# Patient Record
Sex: Female | Born: 1965 | Race: White | Hispanic: No | Smoking: Former smoker
Health system: Southern US, Community
[De-identification: ages and names within clinical notes are randomized; demographics above are authoritative.]

## PROBLEM LIST (undated history)

## (undated) DIAGNOSIS — E039 Hypothyroidism, unspecified: Secondary | ICD-10-CM

## (undated) DIAGNOSIS — E063 Autoimmune thyroiditis: Secondary | ICD-10-CM

## (undated) DIAGNOSIS — E785 Hyperlipidemia, unspecified: Secondary | ICD-10-CM

## (undated) HISTORY — DX: Hyperlipidemia, unspecified: E78.5

## (undated) HISTORY — DX: Autoimmune thyroiditis: E06.3

## (undated) HISTORY — DX: Hypothyroidism, unspecified: E03.9

## (undated) HISTORY — PX: CHEST WALL BIOPSY: SHX1338

---

## 2015-10-20 HISTORY — PX: BREAST BIOPSY: SHX20

## 2017-12-24 ENCOUNTER — Other Ambulatory Visit: Payer: Self-pay | Admitting: Obstetrics and Gynecology

## 2017-12-29 ENCOUNTER — Other Ambulatory Visit: Payer: Self-pay | Admitting: Obstetrics and Gynecology

## 2017-12-29 DIAGNOSIS — N63 Unspecified lump in unspecified breast: Secondary | ICD-10-CM

## 2018-01-07 ENCOUNTER — Other Ambulatory Visit: Payer: Self-pay | Admitting: Obstetrics and Gynecology

## 2018-01-07 DIAGNOSIS — N63 Unspecified lump in unspecified breast: Secondary | ICD-10-CM

## 2018-01-07 DIAGNOSIS — R928 Other abnormal and inconclusive findings on diagnostic imaging of breast: Secondary | ICD-10-CM

## 2018-01-07 DIAGNOSIS — N6001 Solitary cyst of right breast: Secondary | ICD-10-CM

## 2018-01-18 ENCOUNTER — Ambulatory Visit
Admission: RE | Admit: 2018-01-18 | Discharge: 2018-01-18 | Disposition: A | Discharge status: Home / Self Care | Payer: Managed Care, Other (non HMO) | Source: Ambulatory Visit | Attending: Obstetrics and Gynecology | Admitting: Obstetrics and Gynecology

## 2018-01-18 ENCOUNTER — Ambulatory Visit: Payer: Self-pay

## 2018-01-18 DIAGNOSIS — R928 Other abnormal and inconclusive findings on diagnostic imaging of breast: Secondary | ICD-10-CM

## 2018-01-18 DIAGNOSIS — N6001 Solitary cyst of right breast: Secondary | ICD-10-CM

## 2018-01-18 DIAGNOSIS — N63 Unspecified lump in unspecified breast: Secondary | ICD-10-CM

## 2018-03-24 ENCOUNTER — Other Ambulatory Visit: Payer: Self-pay | Admitting: Internal Medicine

## 2018-03-24 DIAGNOSIS — E042 Nontoxic multinodular goiter: Secondary | ICD-10-CM

## 2018-04-29 ENCOUNTER — Ambulatory Visit
Admission: RE | Admit: 2018-04-29 | Discharge: 2018-04-29 | Disposition: A | Discharge status: Home / Self Care | Payer: Managed Care, Other (non HMO) | Source: Ambulatory Visit | Attending: Internal Medicine | Admitting: Internal Medicine

## 2018-04-29 DIAGNOSIS — E042 Nontoxic multinodular goiter: Secondary | ICD-10-CM

## 2018-05-17 ENCOUNTER — Encounter: Payer: Self-pay | Admitting: Endocrinology

## 2018-05-17 ENCOUNTER — Ambulatory Visit: Payer: Managed Care, Other (non HMO) | Admitting: Endocrinology

## 2018-05-17 DIAGNOSIS — E039 Hypothyroidism, unspecified: Secondary | ICD-10-CM

## 2018-05-17 DIAGNOSIS — E079 Disorder of thyroid, unspecified: Secondary | ICD-10-CM | POA: Diagnosis not present

## 2018-05-17 NOTE — Progress Notes (Signed)
Subjective:    Patient ID: Veronica Schwartz, female    DOB: 11-04-1965, 52 y.o.   MRN: [id number]  HPI Pt is referred by Dr Corinna Capra, for nodular thyroid.  Pt was dx'ed with hypothyroidism in 1983.  She was noted to have a goiter in 2018.  she has no h/o XRT or surgery to the neck.  She has slight pressure sensation at the ant neck, and assoc deepening of the voice.   Past Medical History:  Diagnosis Date  . Hypothyroidism     Social History   Socioeconomic History  . Marital status: Single    Spouse name: Not on file  . Number of children: Not on file  . Years of education: Not on file  . Highest education level: Not on file  Occupational History  . Not on file  Social Needs  . Financial resource strain: Not on file  . Food insecurity:    Worry: Not on file    Inability: Not on file  . Transportation needs:    Medical: Not on file    Non-medical: Not on file  Tobacco Use  . Smoking status: Former Research scientist (life sciences)  . Smokeless tobacco: Never Used  Substance and Sexual Activity  . Alcohol use: Not Currently  . Drug use: Never  . Sexual activity: Not Currently  Lifestyle  . Physical activity:    Days per week: Not on file    Minutes per session: Not on file  . Stress: Not on file  Relationships  . Social connections:    Talks on phone: Not on file    Gets together: Not on file    Attends religious service: Not on file    Active member of club or organization: Not on file    Attends meetings of clubs or organizations: Not on file    Relationship status: Not on file  . Intimate partner violence:    Fear of current or ex partner: Not on file    Emotionally abused: Not on file    Physically abused: Not on file    Forced sexual activity: Not on file  Other Topics Concern  . Not on file  Social History Narrative  . Not on file    Current Outpatient Medications on File Prior to Visit  Medication Sig Dispense Refill  . ACZONE 7.5 % GEL APPLY A thin layer TO FACE IN THE morning  2    . SULFACLEANSE 8/4 8-4 % SUSP     . SYNTHROID 125 MCG tablet     . valACYclovir (VALTREX) 1000 MG tablet      No current facility-administered medications on file prior to visit.     Allergies  Allergen Reactions  . Penicillins     Family History  Problem Relation Age of Onset  . Breast cancer Mother 34  . Breast cancer Maternal Grandmother 60    BP (!) 96/50 (BP Location: Right Arm, Patient Position: Sitting, Cuff Size: Normal)   Pulse 72   Temp 98.8 F (37.1 C) (Oral)   Ht 5' 2.25" (1.581 m)   Wt 131 lb (59.4 kg)   LMP 04/26/2018   SpO2 97%   BMI 23.77 kg/m    Review of Systems Denies weight change, fatigue, neck pain, visual loss, chest pain, sob, cough, diarrhea, itching, flushing, easy bruising, depression, cold intolerance, headache, and numbness.  She has slightly painful swallowing and rhinorrhea.     Objective:   Physical Exam VS: see vs page GEN: no distress  HEAD: head: no deformity eyes: no periorbital swelling, no proptosis external nose and ears are normal mouth: no lesion seen NECK: approx 5 cm right thyroid mass.  CHEST WALL: no deformity LUNGS: clear to auscultation CV: reg rate and rhythm, no murmur ABD: abdomen is soft, nontender.  no hepatosplenomegaly.  not distended.  no hernia MUSCULOSKELETAL: muscle bulk and strength are grossly normal.  no obvious joint swelling.  gait is normal and steady EXTEMITIES: no deformity.  no ulcer on the feet.  feet are of normal color and temp.  no edema PULSES: dorsalis pedis intact bilat.  no carotid bruit NEURO:  cn 2-12 grossly intact.   readily moves all 4's.  sensation is intact to touch on the feet SKIN:  Normal texture and temperature.  No rash or suspicious lesion is visible.   NODES:  None palpable at the neck PSYCH: alert, well-oriented.  Does not appear anxious nor depressed.   [place] (2019): Multinodular and heterogeneous appearance of the thyroid gland with larger right lobe compared to left. It  is impossible to accurately measure nodules as heterogeneous nodular parenchyma is seen throughout both lobes. No significant suspicious features are identified in the thyroid gland by ultrasound.  outside test results are reviewed: [place] (2018) right thyroid mass was slightly smaller than 2019  We have requested ROI for past [place] and cytology from CT (approx 2016).    Lab Results  Component Value Date   TSH 0.62 05/19/2018      Assessment & Plan:  Thyroid mass, new to me, uncertain etiology.  Euthyroid.  Due to enlargement and size, I advised resection.    Patient Instructions  blood tests are requested for you today.  We'll let you know about the results.  Please see Dr Harlow Asa soon, as scheduled.

## 2018-05-17 NOTE — Patient Instructions (Addendum)
blood tests are requested for you today.  We'll let you know about the results.  Please see Dr Harlow Asa soon, as scheduled.

## 2018-05-18 ENCOUNTER — Other Ambulatory Visit: Payer: Managed Care, Other (non HMO)

## 2018-05-19 ENCOUNTER — Other Ambulatory Visit (INDEPENDENT_AMBULATORY_CARE_PROVIDER_SITE_OTHER): Payer: Managed Care, Other (non HMO)

## 2018-05-19 ENCOUNTER — Encounter: Payer: Self-pay | Admitting: Endocrinology

## 2018-05-19 DIAGNOSIS — E079 Disorder of thyroid, unspecified: Secondary | ICD-10-CM | POA: Diagnosis not present

## 2018-05-19 DIAGNOSIS — E039 Hypothyroidism, unspecified: Secondary | ICD-10-CM | POA: Insufficient documentation

## 2018-05-19 LAB — TSH: TSH: 0.62 u[IU]/mL (ref 0.35–4.50)

## 2018-05-20 ENCOUNTER — Telehealth: Payer: Self-pay | Admitting: Emergency Medicine

## 2018-05-20 NOTE — Telephone Encounter (Signed)
I have faxed to Dr. Catalina Antigua.

## 2018-05-20 NOTE — Telephone Encounter (Signed)
Pt called back and asked if Lab work can been sent over to Dr Harlow Asa at [facility] on [facility] . Phone   Number is [phone] and Fax number is [phone]. Thanks.

## 2018-05-25 ENCOUNTER — Ambulatory Visit: Payer: Self-pay | Admitting: Surgery

## 2018-08-02 NOTE — Patient Instructions (Addendum)
Veronica Schwartz  08/02/2018   Your procedure is scheduled on: 08-17-18  Report to [facility] Main  Entrance  Report to admitting at      [place] AM    Call this number if you have problems the morning of surgery [phone]    Remember: Do not eat food or drink liquids :After Midnight. BRUSH YOUR TEETH MORNING OF SURGERY AND RINSE YOUR MOUTH OUT,       NO CHEWING GUM CANDY OR MINTS.     Take these medicines the morning of surgery with A SIP OF WATER: valtrex,  synthyroid                                You may not have any metal on your body including hair pins and              piercings  Do not wear jewelry, make-up, lotions, powders or perfumes, deodorant             Do not wear nail polish.  Do not shave  48 hours prior to surgery.               Do not bring valuables to the hospital. [place].  Contacts, dentures or bridgework may not be worn into surgery.  Leave suitcase in the car. After surgery it may be brought to your room.               Please read over the following fact sheets you were given: _____________________________________________________________________           [facility] - Preparing for Surgery Before surgery, you can play an important role.  Because skin is not sterile, your skin needs to be as free of germs as possible.  You can reduce the number of germs on your skin by washing with CHG (chlorahexidine gluconate) soap before surgery.  CHG is an antiseptic cleaner which kills germs and bonds with the skin to continue killing germs even after washing. Please DO NOT use if you have an allergy to CHG or antibacterial soaps.  If your skin becomes reddened/irritated stop using the CHG and inform your nurse when you arrive at Short Stay. Do not shave (including legs and underarms) for at least 48 hours prior to the first CHG shower.  You may shave your face/neck. Please follow these instructions  carefully:  1.  Shower with CHG Soap the night before surgery and the  morning of Surgery.  2.  If you choose to wash your hair, wash your hair first as usual with your  normal  shampoo.  3.  After you shampoo, rinse your hair and body thoroughly to remove the  shampoo.                           4.  Use CHG as you would any other liquid soap.  You can apply chg directly  to the skin and wash                       Gently with a scrungie or clean washcloth.  5.  Apply the CHG Soap to your body ONLY FROM THE NECK  DOWN.   Do not use on face/ open                           Wound or open sores. Avoid contact with eyes, ears mouth and genitals (private parts).                       Wash face,  Genitals (private parts) with your normal soap.             6.  Wash thoroughly, paying special attention to the area where your surgery  will be performed.  7.  Thoroughly rinse your body with warm water from the neck down.  8.  DO NOT shower/wash with your normal soap after using and rinsing off  the CHG Soap.                9.  Pat yourself dry with a clean towel.            10.  Wear clean pajamas.            11.  Place clean sheets on your bed the night of your first shower and do not  sleep with pets. Day of Surgery : Do not apply any lotions/deodorants the morning of surgery.  Please wear clean clothes to the hospital/surgery center.  FAILURE TO FOLLOW THESE INSTRUCTIONS MAY RESULT IN THE CANCELLATION OF YOUR SURGERY PATIENT SIGNATURE_________________________________  NURSE SIGNATURE__________________________________  ________________________________________________________________________

## 2018-08-05 ENCOUNTER — Other Ambulatory Visit: Payer: Self-pay

## 2018-08-05 ENCOUNTER — Encounter (HOSPITAL_COMMUNITY): Payer: Self-pay

## 2018-08-05 ENCOUNTER — Ambulatory Visit (HOSPITAL_COMMUNITY)
Admission: RE | Admit: 2018-08-05 | Discharge: 2018-08-05 | Disposition: A | Discharge status: Home / Self Care | Payer: Managed Care, Other (non HMO) | Source: Ambulatory Visit | Attending: Anesthesiology | Admitting: Anesthesiology

## 2018-08-05 ENCOUNTER — Encounter (HOSPITAL_COMMUNITY)
Admission: RE | Admit: 2018-08-05 | Discharge: 2018-08-05 | Disposition: A | Discharge status: Home / Self Care | Payer: Managed Care, Other (non HMO) | Source: Ambulatory Visit | Attending: Surgery | Admitting: Surgery

## 2018-08-05 DIAGNOSIS — Z01818 Encounter for other preprocedural examination: Secondary | ICD-10-CM

## 2018-08-05 DIAGNOSIS — E063 Autoimmune thyroiditis: Secondary | ICD-10-CM | POA: Diagnosis not present

## 2018-08-05 DIAGNOSIS — D440 Neoplasm of uncertain behavior of thyroid gland: Secondary | ICD-10-CM | POA: Diagnosis not present

## 2018-08-05 LAB — CBC
HEMATOCRIT: 41.6 % (ref 36.0–46.0)
Hemoglobin: 13.8 g/dL (ref 12.0–15.0)
MCH: 34.8 pg — AB (ref 26.0–34.0)
MCHC: 33.2 g/dL (ref 30.0–36.0)
MCV: 105.1 fL — AB (ref 80.0–100.0)
NRBC: 0 % (ref 0.0–0.2)
Platelets: 297 10*3/uL (ref 150–400)
RBC: 3.96 MIL/uL (ref 3.87–5.11)
RDW: 12.1 % (ref 11.5–15.5)
WBC: 6.4 10*3/uL (ref 4.0–10.5)

## 2018-08-05 LAB — HCG, SERUM, QUALITATIVE: Preg, Serum: NEGATIVE

## 2018-08-15 ENCOUNTER — Encounter (HOSPITAL_COMMUNITY): Payer: Self-pay | Admitting: Surgery

## 2018-08-15 NOTE — H&P (Signed)
General Surgery [facility]  Veronica Schwartz DOB: Feb 04, 1966 Divorced / Language: English / Race: White Female   History of Present Illness   The patient is a 52 year old female who presents with a complaint of Enlarged thyroid.  CHIEF COMPLAINT: Hashimoto's thyroiditis, enlarged right lobe  Patient is referred by Dr. Renato Shin for surgical evaluation and management of Hashimoto's thyroiditis with enlargement of the right thyroid lobe with mild to moderate compressive symptoms. Patient has had a long-standing history of Hashimoto's thyroiditis dating back to age 53 years. She is on Synthroid 125 g daily with a normal TSH level of 0.62. Patient has recently relocated from [place] to [place]. Most of her follow-up has been in [place] including sequential ultrasound scanning and previous fine-needle aspiration biopsies. Biopsies reportedly showed Hurthle cell changes. Patient has previously discussed surgery but opted for continued observation. Patient was seen by Dr. Lissa Merlin in consultation. She underwent an ultrasound of the thyroid on April 29, 2018. This shows an enlarged right thyroid lobe measuring 7.3 x 2.8 x 3.5 cm. Left lobe is normal in size. Entire thyroid gland is heterogeneous but without discrete or dominant nodules. There are no worrisome findings. Biopsy was not recommended. Patient is now developing mild compressive symptoms with changes in voice quality, snoring, and discomfort in the right thyroid lobe. She denies any dysphagia. After consultation with her endocrinologist, the patient is interested in proceeding with right thyroid lobectomy for control of symptoms and definitive diagnosis. Patient has had no prior head or neck surgery. There is a family history of thyroid goiter in the patient's mother who underwent surgical resection. There is no family history of other endocrine neoplasms.   Past Surgical History Breast Biopsy   Left. Cesarean Section - 1  Colon Polyp Removal - Colonoscopy   Diagnostic Studies History  Colonoscopy  1-5 years ago Mammogram  within last year Pap Smear  1-5 years ago  Allergies Penicillins   Medication History ValACYclovir HCl (1GM Tablet, Oral) Active. Synthroid (125MCG Tablet, Oral) Active.  Social History Alcohol use  Recently quit alcohol use. Caffeine use  Coffee. No drug use  Tobacco use  Former smoker.  Family History Breast Cancer  Mother. Heart Disease  Brother, Father. Heart disease in female family member before age 109  Respiratory Condition  Mother. Thyroid problems  Mother.  Pregnancy / Birth History  Age at menarche  67 years. Gravida  3 Length (months) of breastfeeding  12-24 Maternal age  29-40 Para  2 Regular periods   Other Problems  Thyroid Disease  Review of Systems  General Not Present- Appetite Loss, Chills, Fatigue, Fever, Night Sweats, Weight Gain and Weight Loss. Skin Not Present- Change in Wart/Mole, Dryness, Hives, Jaundice, New Lesions, Non-Healing Wounds, Rash and Ulcer. HEENT Present- Hoarseness. Not Present- Earache, Hearing Loss, Nose Bleed, Oral Ulcers, Ringing in the Ears, Seasonal Allergies, Sinus Pain, Sore Throat, Visual Disturbances, Wears glasses/contact lenses and Yellow Eyes. Respiratory Present- Snoring. Not Present- Bloody sputum, Chronic Cough, Difficulty Breathing and Wheezing. Breast Not Present- Breast Mass, Breast Pain, Nipple Discharge and Skin Changes. Cardiovascular Not Present- Chest Pain, Difficulty Breathing Lying Down, Leg Cramps, Palpitations, Rapid Heart Rate, Shortness of Breath and Swelling of Extremities. Gastrointestinal Not Present- Abdominal Pain, Bloating, Bloody Stool, Change in Bowel Habits, Chronic diarrhea, Constipation, Difficulty Swallowing, Excessive gas, Gets full quickly at meals, Hemorrhoids, Indigestion, Nausea, Rectal Pain and Vomiting. Female Genitourinary Not  Present- Frequency, Nocturia, Painful Urination, Pelvic Pain and Urgency. Musculoskeletal  Not Present- Back Pain, Joint Pain, Joint Stiffness, Muscle Pain, Muscle Weakness and Swelling of Extremities. Neurological Not Present- Decreased Memory, Fainting, Headaches, Numbness, Seizures, Tingling, Tremor, Trouble walking and Weakness. Psychiatric Not Present- Anxiety, Bipolar, Change in Sleep Pattern, Depression, Fearful and Frequent crying. Endocrine Not Present- Cold Intolerance, Excessive Hunger, Hair Changes, Heat Intolerance, Hot flashes and New Diabetes. Hematology Not Present- Blood Thinners, Easy Bruising, Excessive bleeding, Gland problems, HIV and Persistent Infections.  Vitals Weight: 129.8 lb Height: 62in Body Surface Area: 1.59 m Body Mass Index: 23.74 kg/m  Temp.: 97.38F  Pulse: 59 (Regular)  BP: 90/52 (Sitting, Left Arm, Standard)  Physical Exam   See vital signs recorded above  GENERAL APPEARANCE Development: normal Nutritional status: normal Gross deformities: none  SKIN Rash, lesions, ulcers: none Induration, erythema: none Nodules: none palpable  EYES Conjunctiva and lids: normal Pupils: equal and reactive Iris: normal bilaterally  EARS, NOSE, MOUTH, THROAT External ears: no lesion or deformity External nose: no lesion or deformity Hearing: grossly normal Lips: no lesion or deformity Dentition: normal for age Oral mucosa: moist  NECK Symmetric: no Trachea: midline Thyroid: Left thyroid lobe is normal in size without appreciable masses; right thyroid lobe is visibly enlarged, very firm, mildly to moderately tender to palpation. It is mobile with swallowing.  CHEST Respiratory effort: normal Retraction or accessory muscle use: no Breath sounds: normal bilaterally Rales, rhonchi, wheeze: none  CARDIOVASCULAR Auscultation: regular rhythm, normal rate Murmurs: none Pulses: carotid and radial pulse 2+ palpable Lower extremity edema:  none Lower extremity varicosities: none  MUSCULOSKELETAL Station and gait: normal Digits and nails: no clubbing or cyanosis Muscle strength: grossly normal all extremities Range of motion: grossly normal all extremities Deformity: none  LYMPHATIC Cervical: none palpable Supraclavicular: none palpable  PSYCHIATRIC Oriented to person, place, and time: yes Mood and affect: normal for situation Judgment and insight: appropriate for situation   Assessment & Plan  HASHIMOTO'S THYROIDITIS (E06.3) RIGHT THYROID NODULE (E04.1)  Pt Education - Pamphlet Given - The Thyroid Book: discussed with patient and provided information.  Patient is referred by her endocrinologist for evaluation for right thyroid lobectomy for management of enlarged right thyroid lobe with history of Hashimoto's thyroiditis and Hurthle cell changes. Patient is provided with written literature on thyroid surgery to review at home.  We reviewed the ultrasound report from early July. We discussed her clinical history. We reviewed her laboratory studies. We discussed indications for thyroid surgery. Patient has an enlarging right thyroid lobe which has mild compressive symptoms. She has had previous biopsies which places her in an intermediate risk for underlying malignancy. Patient could certainly be followed with sequential ultrasound scanning and laboratory studies and physical examination. However, there is the option to proceed with right thyroid lobectomy for definitive diagnosis and management. I would not recommend total thyroidectomy and less malignancy were confirmed.  We have discussed right thyroid lobectomy in detail. We have discussed the risk and benefits of the procedure including the risk of injury to recurrent laryngeal nerves and the parathyroid glands. We have discussed the location of the surgical incision. We discussed the hospital stay to be anticipated. We have discussed the postoperative  recovery and return to activities. We have discussed the potential need for additional surgery in the event of malignancy. Patient understands and wishes to proceed with surgery in the near future.  The risks and benefits of the procedure have been discussed at length with the patient. The patient understands the proposed procedure, potential alternative treatments, and the  course of recovery to be expected. All of the patient's questions have been answered at this time. The patient wishes to proceed with surgery.  Armandina Gemma, [facility] Office: [phone]

## 2018-08-17 ENCOUNTER — Other Ambulatory Visit: Payer: Self-pay

## 2018-08-17 ENCOUNTER — Observation Stay (HOSPITAL_COMMUNITY)
Admission: RE | Admit: 2018-08-17 | Discharge: 2018-08-18 | Disposition: A | Discharge status: Home / Self Care | Payer: Managed Care, Other (non HMO) | Source: Ambulatory Visit | Attending: Surgery | Admitting: Surgery

## 2018-08-17 ENCOUNTER — Ambulatory Visit (HOSPITAL_COMMUNITY): Payer: Managed Care, Other (non HMO) | Admitting: Anesthesiology

## 2018-08-17 ENCOUNTER — Encounter (HOSPITAL_COMMUNITY)
Admission: RE | Disposition: A | Discharge status: Home / Self Care | Payer: Self-pay | Source: Ambulatory Visit | Attending: Surgery

## 2018-08-17 ENCOUNTER — Encounter (HOSPITAL_COMMUNITY): Payer: Self-pay | Admitting: *Deleted

## 2018-08-17 DIAGNOSIS — Z880 Allergy status to penicillin: Secondary | ICD-10-CM | POA: Insufficient documentation

## 2018-08-17 DIAGNOSIS — Z87891 Personal history of nicotine dependence: Secondary | ICD-10-CM | POA: Diagnosis not present

## 2018-08-17 DIAGNOSIS — E063 Autoimmune thyroiditis: Principal | ICD-10-CM | POA: Insufficient documentation

## 2018-08-17 DIAGNOSIS — E039 Hypothyroidism, unspecified: Secondary | ICD-10-CM | POA: Diagnosis present

## 2018-08-17 DIAGNOSIS — E079 Disorder of thyroid, unspecified: Secondary | ICD-10-CM | POA: Diagnosis present

## 2018-08-17 DIAGNOSIS — E041 Nontoxic single thyroid nodule: Secondary | ICD-10-CM | POA: Diagnosis present

## 2018-08-17 HISTORY — PX: THYROID LOBECTOMY: SHX420

## 2018-08-17 SURGERY — LOBECTOMY, THYROID
Anesthesia: General | Laterality: Right

## 2018-08-17 MED ORDER — MIDAZOLAM HCL 2 MG/2ML IJ SOLN
INTRAMUSCULAR | Status: AC
  Filled 2018-08-17: qty 2

## 2018-08-17 MED ORDER — CHLORHEXIDINE GLUCONATE CLOTH 2 % EX PADS: 6.0000 | MEDICATED_PAD | Freq: Once | CUTANEOUS | Status: DC

## 2018-08-17 MED ORDER — ACETAMINOPHEN 500 MG PO TABS: 1000.0000 mg | ORAL_TABLET | Freq: Once | ORAL | Status: DC | PRN

## 2018-08-17 MED ORDER — CIPROFLOXACIN IN D5W 400 MG/200ML IV SOLN
400.0000 mg | INTRAVENOUS | Status: AC
  Administered 2018-08-17: 400 mg via INTRAVENOUS
  Filled 2018-08-17: qty 200

## 2018-08-17 MED ORDER — ROCURONIUM BROMIDE 10 MG/ML (PF) SYRINGE
PREFILLED_SYRINGE | INTRAVENOUS | Status: AC
  Filled 2018-08-17: qty 20

## 2018-08-17 MED ORDER — SUGAMMADEX SODIUM 200 MG/2ML IV SOLN
INTRAVENOUS | Status: AC
  Filled 2018-08-17: qty 4

## 2018-08-17 MED ORDER — LIDOCAINE 2% (20 MG/ML) 5 ML SYRINGE
INTRAMUSCULAR | Status: DC | PRN
  Administered 2018-08-17: 100 mg via INTRAVENOUS

## 2018-08-17 MED ORDER — ONDANSETRON HCL 4 MG/2ML IJ SOLN: 4.0000 mg | Freq: Four times a day (QID) | INTRAMUSCULAR | Status: DC | PRN

## 2018-08-17 MED ORDER — MIDAZOLAM HCL 5 MG/5ML IJ SOLN
INTRAMUSCULAR | Status: DC | PRN
  Administered 2018-08-17: 2 mg via INTRAVENOUS

## 2018-08-17 MED ORDER — FENTANYL CITRATE (PF) 100 MCG/2ML IJ SOLN
INTRAMUSCULAR | Status: DC | PRN
  Administered 2018-08-17: 50 ug via INTRAVENOUS
  Administered 2018-08-17: 25 ug via INTRAVENOUS
  Administered 2018-08-17: 75 ug via INTRAVENOUS
  Administered 2018-08-17: 50 ug via INTRAVENOUS

## 2018-08-17 MED ORDER — OXYCODONE HCL 5 MG/5ML PO SOLN
5.0000 mg | Freq: Once | ORAL | Status: DC | PRN
  Filled 2018-08-17: qty 5

## 2018-08-17 MED ORDER — ACETAMINOPHEN 160 MG/5ML PO SOLN: 1000.0000 mg | Freq: Once | ORAL | Status: DC | PRN

## 2018-08-17 MED ORDER — SUGAMMADEX SODIUM 200 MG/2ML IV SOLN
INTRAVENOUS | Status: DC | PRN
  Administered 2018-08-17: 150 mg via INTRAVENOUS

## 2018-08-17 MED ORDER — FENTANYL CITRATE (PF) 100 MCG/2ML IJ SOLN
25.0000 ug | INTRAMUSCULAR | Status: DC | PRN
  Administered 2018-08-17: 50 ug via INTRAVENOUS

## 2018-08-17 MED ORDER — ACETAMINOPHEN 650 MG RE SUPP
650.0000 mg | Freq: Four times a day (QID) | RECTAL | Status: DC | PRN
  Filled 2018-08-17: qty 1

## 2018-08-17 MED ORDER — EPHEDRINE 5 MG/ML INJ
INTRAVENOUS | Status: AC
  Filled 2018-08-17: qty 10

## 2018-08-17 MED ORDER — FENTANYL CITRATE (PF) 100 MCG/2ML IJ SOLN
INTRAMUSCULAR | Status: AC
  Filled 2018-08-17: qty 2

## 2018-08-17 MED ORDER — 0.9 % SODIUM CHLORIDE (POUR BTL) OPTIME
TOPICAL | Status: DC | PRN
  Administered 2018-08-17: 1000 mL

## 2018-08-17 MED ORDER — ACETAMINOPHEN 10 MG/ML IV SOLN: 1000.0000 mg | Freq: Once | INTRAVENOUS | Status: DC | PRN

## 2018-08-17 MED ORDER — PROPOFOL 10 MG/ML IV BOLUS
INTRAVENOUS | Status: DC | PRN
  Administered 2018-08-17: 180 mg via INTRAVENOUS

## 2018-08-17 MED ORDER — GUAIFENESIN-DM 100-10 MG/5ML PO SYRP: 10.0000 mL | ORAL_SOLUTION | ORAL | Status: DC | PRN

## 2018-08-17 MED ORDER — KCL IN DEXTROSE-NACL 20-5-0.45 MEQ/L-%-% IV SOLN
INTRAVENOUS | Status: DC
  Administered 2018-08-17: 16:00:00 via INTRAVENOUS
  Filled 2018-08-17: qty 1000

## 2018-08-17 MED ORDER — ZOLPIDEM TARTRATE 5 MG PO TABS
5.0000 mg | ORAL_TABLET | Freq: Once | ORAL | Status: AC
  Administered 2018-08-17: 5 mg via ORAL
  Filled 2018-08-17: qty 1

## 2018-08-17 MED ORDER — ONDANSETRON HCL 4 MG/2ML IJ SOLN
INTRAMUSCULAR | Status: AC
  Filled 2018-08-17: qty 2

## 2018-08-17 MED ORDER — ROCURONIUM BROMIDE 10 MG/ML (PF) SYRINGE
PREFILLED_SYRINGE | INTRAVENOUS | Status: DC | PRN
  Administered 2018-08-17: 50 mg via INTRAVENOUS
  Administered 2018-08-17 (×2): 10 mg via INTRAVENOUS

## 2018-08-17 MED ORDER — DEXAMETHASONE SODIUM PHOSPHATE 10 MG/ML IJ SOLN
INTRAMUSCULAR | Status: DC | PRN
  Administered 2018-08-17: 10 mg via INTRAVENOUS

## 2018-08-17 MED ORDER — ONDANSETRON HCL 4 MG/2ML IJ SOLN
INTRAMUSCULAR | Status: DC | PRN
  Administered 2018-08-17 (×2): 4 mg via INTRAVENOUS

## 2018-08-17 MED ORDER — TRAMADOL HCL 50 MG PO TABS
50.0000 mg | ORAL_TABLET | Freq: Four times a day (QID) | ORAL | Status: DC | PRN
  Administered 2018-08-17 – 2018-08-18 (×2): 50 mg via ORAL
  Filled 2018-08-17 (×2): qty 1

## 2018-08-17 MED ORDER — PROPOFOL 10 MG/ML IV BOLUS
INTRAVENOUS | Status: AC
  Filled 2018-08-17: qty 20

## 2018-08-17 MED ORDER — LIDOCAINE 2% (20 MG/ML) 5 ML SYRINGE
INTRAMUSCULAR | Status: AC
  Filled 2018-08-17: qty 10

## 2018-08-17 MED ORDER — EPHEDRINE SULFATE-NACL 50-0.9 MG/10ML-% IV SOSY
PREFILLED_SYRINGE | INTRAVENOUS | Status: DC | PRN
  Administered 2018-08-17: 10 mg via INTRAVENOUS

## 2018-08-17 MED ORDER — LACTATED RINGERS IV SOLN
INTRAVENOUS | Status: DC
  Administered 2018-08-17: 09:00:00 via INTRAVENOUS

## 2018-08-17 MED ORDER — OXYCODONE HCL 5 MG PO TABS: 5.0000 mg | ORAL_TABLET | Freq: Once | ORAL | Status: DC | PRN

## 2018-08-17 MED ORDER — ONDANSETRON HCL 4 MG/2ML IJ SOLN
INTRAMUSCULAR | Status: AC
  Filled 2018-08-17: qty 6

## 2018-08-17 MED ORDER — DEXAMETHASONE SODIUM PHOSPHATE 10 MG/ML IJ SOLN
INTRAMUSCULAR | Status: AC
  Filled 2018-08-17: qty 2

## 2018-08-17 MED ORDER — HYDROCODONE-ACETAMINOPHEN 5-325 MG PO TABS
1.0000 | ORAL_TABLET | ORAL | Status: DC | PRN
  Administered 2018-08-18: 1 via ORAL
  Filled 2018-08-17: qty 1

## 2018-08-17 MED ORDER — HEMOSTATIC AGENTS (NO CHARGE) OPTIME
TOPICAL | Status: DC | PRN
  Administered 2018-08-17: 1 via TOPICAL

## 2018-08-17 MED ORDER — LIP MEDEX EX OINT
TOPICAL_OINTMENT | CUTANEOUS | Status: AC
  Filled 2018-08-17: qty 7

## 2018-08-17 MED ORDER — HYDROMORPHONE HCL 1 MG/ML IJ SOLN: 1.0000 mg | INTRAMUSCULAR | Status: DC | PRN

## 2018-08-17 MED ORDER — DEXAMETHASONE SODIUM PHOSPHATE 10 MG/ML IJ SOLN
INTRAMUSCULAR | Status: AC
  Filled 2018-08-17: qty 1

## 2018-08-17 MED ORDER — GUAIFENESIN-DM 100-10 MG/5ML PO SYRP: 5.0000 mL | ORAL_SOLUTION | ORAL | Status: DC | PRN

## 2018-08-17 MED ORDER — ACETAMINOPHEN 325 MG PO TABS
650.0000 mg | ORAL_TABLET | Freq: Four times a day (QID) | ORAL | Status: DC | PRN
  Filled 2018-08-17: qty 2

## 2018-08-17 MED ORDER — ONDANSETRON 4 MG PO TBDP: 4.0000 mg | ORAL_TABLET | Freq: Four times a day (QID) | ORAL | Status: DC | PRN

## 2018-08-17 SURGICAL SUPPLY — 37 items
ATTRACTOMAT 16X20 MAGNETIC DRP (DRAPES) ×3 IMPLANT
BLADE SURG 15 STRL LF DISP TIS (BLADE) ×1 IMPLANT
BLADE SURG 15 STRL SS (BLADE) ×2
CHLORAPREP W/TINT 26ML (MISCELLANEOUS) ×6 IMPLANT
CLIP VESOCCLUDE MED 6/CT (CLIP) ×9 IMPLANT
CLIP VESOCCLUDE SM WIDE 6/CT (CLIP) ×9 IMPLANT
CLOSURE WOUND 1/2 X4 (GAUZE/BANDAGES/DRESSINGS) ×1
COVER SURGICAL LIGHT HANDLE (MISCELLANEOUS) ×3 IMPLANT
COVER WAND RF STERILE (DRAPES) IMPLANT
DERMABOND ADVANCED (GAUZE/BANDAGES/DRESSINGS) ×2
DERMABOND ADVANCED .7 DNX12 (GAUZE/BANDAGES/DRESSINGS) ×1 IMPLANT
DISSECTOR ROUND CHERRY 3/8 STR (MISCELLANEOUS) IMPLANT
DRAPE LAPAROTOMY T 98X78 PEDS (DRAPES) ×3 IMPLANT
ELECT PENCIL ROCKER SW 15FT (MISCELLANEOUS) ×3 IMPLANT
ELECT REM PT RETURN 15FT ADLT (MISCELLANEOUS) ×3 IMPLANT
GAUZE 4X4 16PLY RFD (DISPOSABLE) ×3 IMPLANT
GAUZE SPONGE 4X4 12PLY STRL (GAUZE/BANDAGES/DRESSINGS) IMPLANT
GLOVE SURG ORTHO 8.0 STRL STRW (GLOVE) ×3 IMPLANT
GOWN STRL REUS W/TWL XL LVL3 (GOWN DISPOSABLE) ×6 IMPLANT
HEMOSTAT SURGICEL 2X4 FIBR (HEMOSTASIS) ×3 IMPLANT
ILLUMINATOR WAVEGUIDE N/F (MISCELLANEOUS) IMPLANT
KIT BASIN OR (CUSTOM PROCEDURE TRAY) ×3 IMPLANT
LIGHT WAVEGUIDE WIDE FLAT (MISCELLANEOUS) IMPLANT
PACK BASIC VI WITH GOWN DISP (CUSTOM PROCEDURE TRAY) ×3 IMPLANT
POWDER SURGICEL 3.0 GRAM (HEMOSTASIS) IMPLANT
SHEARS HARMONIC 9CM CVD (BLADE) ×3 IMPLANT
STRIP CLOSURE SKIN 1/2X4 (GAUZE/BANDAGES/DRESSINGS) ×2 IMPLANT
SUT MNCRL AB 4-0 PS2 18 (SUTURE) ×3 IMPLANT
SUT SILK 2 0 (SUTURE)
SUT SILK 2-0 18XBRD TIE 12 (SUTURE) IMPLANT
SUT SILK 3 0 (SUTURE)
SUT SILK 3-0 18XBRD TIE 12 (SUTURE) IMPLANT
SUT VIC AB 3-0 SH 18 (SUTURE) ×6 IMPLANT
SYR BULB IRRIGATION 50ML (SYRINGE) ×3 IMPLANT
TOWEL OR 17X26 10 PK STRL BLUE (TOWEL DISPOSABLE) ×3 IMPLANT
TOWEL OR NON WOVEN STRL DISP B (DISPOSABLE) ×3 IMPLANT
YANKAUER SUCT BULB TIP 10FT TU (MISCELLANEOUS) ×3 IMPLANT

## 2018-08-17 NOTE — Anesthesia Preprocedure Evaluation (Signed)
Anesthesia Evaluation  Patient identified by MRN, date of birth, ID band Patient awake    Reviewed: Allergy & Precautions, NPO status , Patient's Chart, lab work & pertinent test results  History of Anesthesia Complications Negative for: history of anesthetic complications  Airway Mallampati: II  TM Distance: >3 FB Neck ROM: Full    Dental  (+) Teeth Intact   Pulmonary Recent URI , former smoker,    breath sounds clear to auscultation       Cardiovascular negative cardio ROS   Rhythm:Regular     Neuro/Psych negative neurological ROS  negative psych ROS   GI/Hepatic negative GI ROS, Neg liver ROS,   Endo/Other  Hypothyroidism Hashimoto's thyroiditis thyroid neoplasm of uncertain behavior     Renal/GU negative Renal ROS     Musculoskeletal negative musculoskeletal ROS (+)   Abdominal   Peds  Hematology negative hematology ROS (+)   Anesthesia Other Findings   Reproductive/Obstetrics                             Anesthesia Physical Anesthesia Plan  ASA: II  Anesthesia Plan: General   Post-op Pain Management:    Induction: Intravenous  PONV Risk Score and Plan: 3 and Ondansetron, Dexamethasone and Midazolam  Airway Management Planned: Oral ETT  Additional Equipment: None  Intra-op Plan:   Post-operative Plan: Extubation in OR  Informed Consent: I have reviewed the patients History and Physical, chart, labs and discussed the procedure including the risks, benefits and alternatives for the proposed anesthesia with the patient or authorized representative who has indicated his/her understanding and acceptance.   Dental advisory given  Plan Discussed with: CRNA and Surgeon  Anesthesia Plan Comments:         Anesthesia Quick Evaluation

## 2018-08-17 NOTE — Op Note (Signed)
Procedure Note  Pre-operative Diagnosis:  Hashimoto's thyroiditis, enlarged thyroid  Post-operative Diagnosis:  same  Surgeon:  Armandina Gemma, MD  Assistant:  none   Procedure:  Right thyroid lobectomy  Anesthesia:  General  Estimated Blood Loss:  minimal  Drains: none         Specimen: thyroid lobe to pathology  Indications:  Patient is referred by Dr. Renato Shin for surgical evaluation and management of Hashimoto's thyroiditis with enlargement of the right thyroid lobe with mild to moderate compressive symptoms. Patient has had a long-standing history of Hashimoto's thyroiditis dating back to age 52 years. She is on Synthroid 125 g daily with a normal TSH level of 0.62. Patient has recently relocated from [place] to [place]. Most of her follow-up has been in [place] including sequential ultrasound scanning and previous fine-needle aspiration biopsies. Biopsies reportedly showed Hurthle cell changes. Patient has previously discussed surgery but opted for continued observation. Patient was seen by Dr. Lissa Merlin in consultation. She underwent an ultrasound of the thyroid on April 29, 2018. This shows an enlarged right thyroid lobe measuring 7.3 x 2.8 x 3.5 cm. Left lobe is normal in size. Entire thyroid gland is heterogeneous but without discrete or dominant nodules. There are no worrisome findings. Biopsy was not recommended. Patient is now developing mild compressive symptoms with changes in voice quality, snoring, and discomfort in the right thyroid lobe. She denies any dysphagia. After consultation with her endocrinologist, the patient is interested in proceeding with right thyroid lobectomy for control of symptoms and definitive diagnosis.  Procedure Details: Procedure was done in OR #4 at the [facility].  The patient was brought to the operating room and placed in a supine position on the operating room table.  Following administration of general  anesthesia, the patient was positioned and then prepped and draped in the usual aseptic fashion.  After ascertaining that an adequate level of anesthesia had been achieved, a Kocher incision was made with #15 blade.  Dissection was carried through subcutaneous tissues and platysma. Hemostasis was achieved with the electrocautery.  Skin flaps were elevated cephalad and caudad from the thyroid notch to the sternal notch.  A self-retaining retractor was placed for exposure.  Strap muscles were incised in the midline and dissection was begun on the right side.  Strap muscles were reflected laterally.  The right thyroid lobe was very firm, enlarged, multinodular, and inflammatory with significant adhesion to surrounding structures.  The lobe was gently mobilized with blunt dissection.  Superior pole vessels were dissected out and divided individually between small and medium Ligaclips with the Harmonic scalpel.  The thyroid lobe was rolled anteriorly.  Branches of the inferior thyroid artery were divided between small Ligaclips with the Harmonic scalpel.  Inferior venous tributaries were divided between Ligaclips.  Both the superior and inferior parathyroid glands were identified and preserved on their vascular pedicles.  The recurrent laryngeal nerve was identified and preserved along its course.  The ligament of Gwenlyn Found was released with the electrocautery and the gland was mobilized onto the anterior trachea. Isthmus was mobilized across the midline.  There was a moderate sized pyramidal lobe present which was resected with the thyroid isthmus.  The thyroid parenchyma was transected at the junction of the isthmus and contralateral thyroid lobe with the Harmonic scalpel.  The thyroid lobe and isthmus were submitted to pathology for review.  The neck was irrigated with warm saline.  Fibrillar was placed throughout the operative field.  Strap muscles were reapproximated  in the midline with interrupted 3-0 Vicryl sutures.   Platysma was closed with interrupted 3-0 Vicryl sutures.  Skin was closed with a running 4-0 Monocryl subcuticular suture.  Wound was washed and dried and Dermabond was applied.  The patient was awakened from anesthesia and brought to the recovery room.  The patient tolerated the procedure well.   Armandina Gemma, MD [facility] Office: [phone]

## 2018-08-17 NOTE — Anesthesia Procedure Notes (Signed)
Procedure Name: Intubation Date/Time: 08/17/2018 10:59 AM Performed by: Lavina Hamman, CRNA Pre-anesthesia Checklist: Patient identified, Emergency Drugs available, Suction available, Patient being monitored and Timeout performed Patient Re-evaluated:Patient Re-evaluated prior to induction Oxygen Delivery Method: Circle system utilized Preoxygenation: Pre-oxygenation with 100% oxygen Induction Type: IV induction Ventilation: Mask ventilation without difficulty Laryngoscope Size: Mac and 4 Grade View: Grade I Tube type: Oral Tube size: 7.0 mm Number of attempts: 1 Airway Equipment and Method: Stylet Placement Confirmation: ETT inserted through vocal cords under direct vision,  positive ETCO2,  CO2 detector and breath sounds checked- equal and bilateral Secured at: 21 cm Tube secured with: Tape Dental Injury: Teeth and Oropharynx as per pre-operative assessment

## 2018-08-17 NOTE — Interval H&P Note (Signed)
History and Physical Interval Note:  08/17/2018 10:26 AM  Veronica Schwartz  has presented today for surgery, with the diagnosis of Hashimoto's thyroiditis.  thyroid neoplasm of uncertain behavior  The various methods of treatment have been discussed with the patient and family. After consideration of risks, benefits and other options for treatment, the patient has consented to    Procedure(s): RIGHT THYROID LOBECTOMY (Right) as a surgical intervention .    The patient's history has been reviewed, patient examined, no change in status, stable for surgery.  I have reviewed the patient's chart and labs.  Questions were answered to the patient's satisfaction.    Armandina Gemma, [facility] Office: [place]

## 2018-08-17 NOTE — Transfer of Care (Signed)
Immediate Anesthesia Transfer of Care Note  Patient: Veronica Schwartz  Procedure(s) Performed: Procedure(s): RIGHT THYROID LOBECTOMY (Right)  Patient Location: PACU  Anesthesia Type:General  Level of Consciousness:  sedated, patient cooperative and responds to stimulation  Airway & Oxygen Therapy:Patient Spontanous Breathing and Patient connected to face mask oxgen  Post-op Assessment:  Report given to PACU RN and Post -op Vital signs reviewed and stable  Post vital signs:  Reviewed and stable  Last Vitals:  Vitals:   08/17/18 0804 08/17/18 1238  BP: 109/65 130/80  Pulse: (!) 58 66  Resp: 16 12  Temp: 37.1 C 37.5 C  SpO2: 984% 730%    Complications: No apparent anesthesia complications

## 2018-08-18 ENCOUNTER — Encounter (HOSPITAL_COMMUNITY): Payer: Self-pay | Admitting: Surgery

## 2018-08-18 DIAGNOSIS — E063 Autoimmune thyroiditis: Secondary | ICD-10-CM | POA: Diagnosis not present

## 2018-08-18 MED ORDER — TRAMADOL HCL 50 MG PO TABS: 50.0000 mg | ORAL_TABLET | Freq: Four times a day (QID) | ORAL | 0 refills | Status: DC | PRN

## 2018-08-18 NOTE — Progress Notes (Signed)
Pt refused SCDs as well as vitals during nightshift.

## 2018-08-18 NOTE — Progress Notes (Signed)
Pt was discharged home today. Instructions were reviewed with patient, and questions were answered. Pt was taken to main entrance via wheelchair by NT.  

## 2018-08-18 NOTE — Discharge Summary (Signed)
  [facility] Discharge Summary   Patient ID: Veronica Schwartz MRN: [id number] DOB/AGE: [birth year] 52 y.o.  Admit date: 08/17/2018 Discharge date: 08/18/2018  Admitting Diagnosis: Hashimoto's thyroiditis, enlarged right lobe  Discharge Diagnosis Patient Active Problem List   Diagnosis Date Noted  . Thyroid nodule 08/17/2018  . Hypothyroidism   . Thyroid mass 05/17/2018    Consultants None  Imaging: No results found.  Procedures Dr. Harlow Asa (08/17/18) -Right thyroid lobectomy  Hospital Course:  Jaydn Fincher is a 52yo female PMH Hashimoto's thyroiditis with enlargement of the right thyroid lobe with mild to moderate compressive symptoms, who was admitted to [facility] for the above mentioned procedure. Tolerated procedure well and was transferred to the floor. Admitted for observation over night. Diet advanced as tolerated. On POD1 the patient was voiding well, tolerating diet, ambulating well, pain well controlled, vital signs stable, incisions c/d/i and felt stable for discharge home.  Patient will follow up as below and knows to call with questions or concerns.     Physical Exam: General:  Alert, NAD, pleasant, comfortable HEENT: pupils equal and round. Incision over thyroid cdi with mild edema/no erythema or drainage Pulm: effort normal Abd:  Soft, ND, NT  Allergies as of 08/18/2018      Reactions   Penicillins Other (See Comments)   Fever convulsion Has patient had a PCN reaction causing immediate rash, facial/tongue/throat swelling, SOB or lightheadedness with hypotension: Unknown Has patient had a PCN reaction causing severe rash involving mucus membranes or skin necrosis: Unknown Has patient had a PCN reaction that required hospitalization: Unknown Has patient had a PCN reaction occurring within the last 10 years: No If all of the above answers are "NO", then may proceed with Cephalosporin use.      Medication List    TAKE these medications   Fish Oil 1000  MG Caps Take 2,000 mg by mouth daily.   MAGNESIUM PO Take 1,000 mg by mouth at bedtime.   PROBIOTIC PO Take 1 capsule by mouth daily.   SYNTHROID 125 MCG tablet Generic drug:  levothyroxine Take 125 mcg by mouth daily before breakfast.   traMADol 50 MG tablet Commonly known as:  ULTRAM Take 1 tablet (50 mg total) by mouth every 6 (six) hours as needed (mild pain).   valACYclovir 1000 MG tablet Commonly known as:  VALTREX Take 1,000 mg by mouth daily.   VITAMIN B-12 PO Take 1 each by mouth daily.   VITAMIN C PO Take 2 each by mouth daily.        Follow-up Information    Armandina Gemma, MD. Call.   Specialty:  General Surgery Why:  The office will call you with a follow up appointment Contact information: [place] [phone]           Signed: [place], [facility] 08/18/2018, 10:40 AM Pager: [phone] Mon 7:00 am -11:30 AM Tues-Fri 7:00 am-4:30 pm Sat-Sun 7:00 am-11:30 am

## 2018-08-18 NOTE — Discharge Instructions (Signed)
[facility]  THYROID & PARATHYROID SURGERY:  POST-OP INSTRUCTIONS  Always review your discharge instruction sheet from the facility where your surgery was performed.  A prescription for pain medication may be given to you upon discharge.  Take your pain medication as prescribed.  If narcotic pain medicine is not needed, then you may take acetaminophen (Tylenol) or ibuprofen (Advil) as needed.  Take your usually prescribed medications unless otherwise directed.  If you need a refill on your pain medication, please contact our office during regular business hours.  Prescriptions cannot be processed by our office after 5 pm or on weekends.  Start with a light diet upon arrival home, such as soup and crackers or toast.  Be sure to drink plenty of fluids daily.  Resume your normal diet the day after surgery.  Most patients will experience some swelling and bruising on the chest and neck area.  Ice packs will help.  Swelling and bruising can take several days to resolve.   It is common to experience some constipation after surgery.  Increasing fluid intake and taking a stool softener (Colace) will usually help or prevent this problem.  A mild laxative (Milk of Magnesia or Miralax) should be taken according to package directions if there has been no bowel movement after 48 hours.  You have steri-strips and a gauze dressing over your incision.  You may remove the gauze bandage on the second day after surgery, and you may shower at that time.  Leave your steri-strips (small skin tapes) in place directly over the incision.  These strips should remain on the skin for 5-7 days and then be removed.  You may get them wet in the shower and pat them dry.  You may resume regular (light) daily activities beginning the next day (such as daily self-care, walking, climbing stairs) gradually increasing activities as tolerated.  You may have sexual intercourse when it is comfortable.  Refrain from any  heavy lifting or straining until approved by your doctor.  You may drive when you no longer are taking prescription pain medication, you can comfortably wear a seatbelt, and you can safely maneuver your car and apply brakes.  You should see your doctor in the office for a follow-up appointment approximately three weeks after your surgery.  Make sure that you call for this appointment within a day or two after you arrive home to insure a convenient appointment time.  WHEN TO CALL YOUR DOCTOR: -- Fever greater than 101.5 -- Inability to urinate -- Nausea and/or vomiting - persistent -- Extreme swelling or bruising -- Continued bleeding from incision -- Increased pain, redness, or drainage from the incision -- Difficulty swallowing or breathing -- Muscle cramping or spasms -- Numbness or tingling in hands or around lips  The clinic staff is available to answer your questions during regular business hours.  Please dont hesitate to call and ask to speak to one of the nurses if you have concerns.  Armandina Gemma, MD [facility] Office: [phone]

## 2018-08-30 NOTE — Anesthesia Postprocedure Evaluation (Signed)
Anesthesia Post Note  Patient: Veronica Schwartz  Procedure(s) Performed: RIGHT THYROID LOBECTOMY (Right )     Patient location during evaluation: PACU Anesthesia Type: General Level of consciousness: awake and alert Pain management: pain level controlled Vital Signs Assessment: post-procedure vital signs reviewed and stable Respiratory status: spontaneous breathing, nonlabored ventilation, respiratory function stable and patient connected to nasal cannula oxygen Cardiovascular status: blood pressure returned to baseline and stable Postop Assessment: no apparent nausea or vomiting Anesthetic complications: no    Last Vitals:  Vitals:   08/17/18 2213 08/18/18 0548  BP: 106/70 99/65  Pulse: 60 (!) 57  Resp: 20 20  Temp: 37.2 C 36.9 C  SpO2: 96% 98%    Last Pain:  Vitals:   08/18/18 0800  TempSrc:   PainSc: 1                  Ramel Tobon

## 2018-08-31 ENCOUNTER — Telehealth: Payer: Self-pay | Admitting: Endocrinology

## 2018-08-31 NOTE — Telephone Encounter (Signed)
Patient stated that she recently had the right lobe of her thyroid taken out. Ever since then she stated she has been extremely tired. She slept through 3 alarms this morning and feels like she cant "get her head off the pillow"   She is concerned about this and wanted to know if she should come in for labs.   Please advise

## 2018-08-31 NOTE — Telephone Encounter (Signed)
Please advise 

## 2018-08-31 NOTE — Telephone Encounter (Signed)
Please advise ov tomorrow or Fri

## 2018-09-01 ENCOUNTER — Ambulatory Visit (INDEPENDENT_AMBULATORY_CARE_PROVIDER_SITE_OTHER): Payer: Managed Care, Other (non HMO) | Admitting: Endocrinology

## 2018-09-01 ENCOUNTER — Encounter: Payer: Self-pay | Admitting: Endocrinology

## 2018-09-01 VITALS — BP 100/70 | HR 73 | Ht 62.0 in | Wt 141.2 lb

## 2018-09-01 DIAGNOSIS — E039 Hypothyroidism, unspecified: Secondary | ICD-10-CM

## 2018-09-01 LAB — BASIC METABOLIC PANEL
BUN: 13 mg/dL (ref 6–23)
CHLORIDE: 102 meq/L (ref 96–112)
CO2: 30 meq/L (ref 19–32)
Calcium: 9.3 mg/dL (ref 8.4–10.5)
Creatinine, Ser: 0.7 mg/dL (ref 0.40–1.20)
GFR: 93.32 mL/min (ref >60.00)
Glucose, Bld: 96 mg/dL (ref 70–99)
POTASSIUM: 4.1 meq/L (ref 3.5–5.1)
SODIUM: 137 meq/L (ref 135–145)

## 2018-09-01 LAB — VITAMIN D 25 HYDROXY (VIT D DEFICIENCY, FRACTURES): VITD: 38.92 ng/mL (ref 30.00–100.00)

## 2018-09-01 LAB — T4, FREE: FREE T4: 0.6 ng/dL (ref 0.60–1.60)

## 2018-09-01 LAB — TSH: TSH: 7.85 u[IU]/mL — AB (ref 0.35–4.50)

## 2018-09-01 MED ORDER — LEVOTHYROXINE SODIUM 150 MCG PO TABS: 150.0000 ug | ORAL_TABLET | Freq: Every day | ORAL | 11 refills | Status: DC

## 2018-09-01 NOTE — Patient Instructions (Addendum)
blood tests are requested for you today.  We'll let you know about the results.  

## 2018-09-01 NOTE — Telephone Encounter (Signed)
Could you please call pt to see if Dr. Loanne Drilling could see her either today or tomorrow?

## 2018-09-01 NOTE — Progress Notes (Signed)
Subjective:    Patient ID: Veronica Schwartz, female    DOB: 09-01-1966, 52 y.o.   MRN: [id number]  HPI Pt returns for f/u of multinodular thyroid(dx'ed with hypothyroidism in 1983, and the goiter in 2018; she had right lobectomy in 2019 (pathol showed LYMPHOCYTIC THYROIDITIS WITH HURTHLE CELL CHANGE).  She has severe fatigue, and assoc weight gain.  Wound is healing well.  She takes vit-D, 1 tab qd (non-rx, uncertain strength).   Past Medical History:  Diagnosis Date  . Hypothyroidism     Past Surgical History:  Procedure Laterality Date  . BREAST BIOPSY Left 2017   benign  . CESAREAN SECTION     2010  . CHEST WALL BIOPSY    . THYROID LOBECTOMY Right 08/17/2018   Procedure: RIGHT THYROID LOBECTOMY;  Surgeon: Armandina Gemma, MD;  Location: [facility];  Service: General;  Laterality: Right;    Social History   Socioeconomic History  . Marital status: Divorced    Spouse name: Not on file  . Number of children: Not on file  . Years of education: Not on file  . Highest education level: Not on file  Occupational History  . Not on file  Social Needs  . Financial resource strain: Not on file  . Food insecurity:    Worry: Not on file    Inability: Not on file  . Transportation needs:    Medical: Not on file    Non-medical: Not on file  Tobacco Use  . Smoking status: Former Smoker    Types: Cigarettes    Last attempt to quit: 07/06/2016    Years since quitting: 2.1  . Smokeless tobacco: Never Used  Substance and Sexual Activity  . Alcohol use: Yes    Alcohol/week: 1.0 standard drinks    Types: 1 Glasses of wine per week    Comment: occasional  . Drug use: Never  . Sexual activity: Not Currently  Lifestyle  . Physical activity:    Days per week: Not on file    Minutes per session: Not on file  . Stress: Not on file  Relationships  . Social connections:    Talks on phone: Not on file    Gets together: Not on file    Attends religious service: Not on file    Active member of  club or organization: Not on file    Attends meetings of clubs or organizations: Not on file    Relationship status: Not on file  . Intimate partner violence:    Fear of current or ex partner: Not on file    Emotionally abused: Not on file    Physically abused: Not on file    Forced sexual activity: Not on file  Other Topics Concern  . Not on file  Social History Narrative  . Not on file    Current Outpatient Medications on File Prior to Visit  Medication Sig Dispense Refill  . Ascorbic Acid (VITAMIN C PO) Take 2 each by mouth daily.    . Cyanocobalamin (VITAMIN B-12 PO) Take 1 each by mouth daily.    Marland Kitchen MAGNESIUM PO Take 1,000 mg by mouth at bedtime.    . Probiotic Product (PROBIOTIC PO) Take 1 capsule by mouth daily.    . traMADol (ULTRAM) 50 MG tablet Take 1 tablet (50 mg total) by mouth every 6 (six) hours as needed (mild pain). 15 tablet 0  . valACYclovir (VALTREX) 1000 MG tablet Take 1,000 mg by mouth daily.     Marland Kitchen  Omega-3 Fatty Acids (FISH OIL) 1000 MG CAPS Take 2,000 mg by mouth daily.     No current facility-administered medications on file prior to visit.     Allergies  Allergen Reactions  . Penicillins Other (See Comments)    Fever convulsion Has patient had a PCN reaction causing immediate rash, facial/tongue/throat swelling, SOB or lightheadedness with hypotension: Unknown Has patient had a PCN reaction causing severe rash involving mucus membranes or skin necrosis: Unknown Has patient had a PCN reaction that required hospitalization: Unknown Has patient had a PCN reaction occurring within the last 10 years: No If all of the above answers are "NO", then may proceed with Cephalosporin use.     Family History  Problem Relation Age of Onset  . Breast cancer Mother 70  . Breast cancer Maternal Grandmother 60    BP 100/70 (BP Location: Left Arm, Patient Position: Sitting, Cuff Size: Normal)   Pulse 73   Ht 5\' 2"  (1.575 m)   Wt 141 lb 3.2 oz (64 kg)   SpO2 92%    BMI 25.83 kg/m   Review of Systems She has dry skin and cold intolerance    Objective:   Physical Exam VS: see vs page GEN: no distress NECK: a healing scar is present.  I do not appreciate a nodule in the thyroid or elsewhere in the neck.         Assessment & Plan:  Hypothyroidism: recheck today Fatigue, new, uncertain etiology.  Given unilat neck surg, hypopara is extremely unlikely, but we'll check anyway.  Patient Instructions  blood tests are requested for you today.  We'll let you know about the results.

## 2018-09-02 LAB — PTH, INTACT AND CALCIUM
Calcium: 9.4 mg/dL (ref 8.6–10.4)
PTH: 42 pg/mL (ref 14–64)

## 2018-10-05 ENCOUNTER — Other Ambulatory Visit (INDEPENDENT_AMBULATORY_CARE_PROVIDER_SITE_OTHER): Payer: Managed Care, Other (non HMO)

## 2018-10-05 ENCOUNTER — Telehealth: Payer: Self-pay | Admitting: Endocrinology

## 2018-10-05 DIAGNOSIS — E039 Hypothyroidism, unspecified: Secondary | ICD-10-CM

## 2018-10-05 LAB — TSH: TSH: 1.69 u[IU]/mL (ref 0.35–4.50)

## 2018-10-05 NOTE — Telephone Encounter (Signed)
Pt has 11 refills at [facility] in [place]

## 2018-10-05 NOTE — Telephone Encounter (Signed)
MEDICATION: levothyroxine (SYNTHROID, LEVOTHROID) 150 MCG tablet  PHARMACY:  [id number] - [place], [place]   IS THIS A 90 DAY SUPPLY : Yes  IS PATIENT OUT OF MEDICATION: Yes  IF NOT; HOW MUCH IS LEFT:   LAST APPOINTMENT DATE: @11 /14/2019  NEXT APPOINTMENT DATE:@12 /18/2019  DO WE HAVE YOUR PERMISSION TO LEAVE A DETAILED MESSAGE:  OTHER COMMENTS:    **Let patient know to contact pharmacy at the end of the day to make sure medication is ready. **  ** Please notify patient to allow 48-72 hours to process**  **Encourage patient to contact the pharmacy for refills or they can request refills through [facility]**

## 2018-10-25 ENCOUNTER — Ambulatory Visit: Payer: Managed Care, Other (non HMO) | Admitting: Endocrinology

## 2018-10-25 ENCOUNTER — Encounter: Payer: Self-pay | Admitting: Endocrinology

## 2018-10-25 VITALS — BP 118/78 | HR 98 | Ht 62.0 in | Wt 140.8 lb

## 2018-10-25 DIAGNOSIS — E039 Hypothyroidism, unspecified: Secondary | ICD-10-CM | POA: Diagnosis not present

## 2018-10-25 LAB — T3, FREE: T3, Free: 3.1 pg/mL (ref 2.3–4.2)

## 2018-10-25 LAB — T4, FREE: Free T4: 1.21 ng/dL (ref 0.60–1.60)

## 2018-10-25 LAB — TSH: TSH: 1.22 u[IU]/mL (ref 0.35–4.50)

## 2018-10-25 NOTE — Progress Notes (Signed)
Subjective:    Patient ID: Veronica Schwartz, female    DOB: [birth year], 53 y.o.   MRN: [id number]  HPI Pt returns for f/u of multinodular thyroid (dx'ed with hypothyroidism in 1983, and the goiter in 2018; she had right lobectomy in 2019 (pathol showed [place]).  She reports ongoing weight gain.   Past Medical History:  Diagnosis Date  . Hypothyroidism     Past Surgical History:  Procedure Laterality Date  . BREAST BIOPSY Left 2017   benign  . CESAREAN SECTION     2010  . CHEST WALL BIOPSY    . THYROID LOBECTOMY Right 08/17/2018   Procedure: RIGHT THYROID LOBECTOMY;  Surgeon: Armandina Gemma, MD;  Location: [facility];  Service: General;  Laterality: Right;    Social History   Socioeconomic History  . Marital status: Divorced    Spouse name: Not on file  . Number of children: Not on file  . Years of education: Not on file  . Highest education level: Not on file  Occupational History  . Not on file  Social Needs  . Financial resource strain: Not on file  . Food insecurity:    Worry: Not on file    Inability: Not on file  . Transportation needs:    Medical: Not on file    Non-medical: Not on file  Tobacco Use  . Smoking status: Former Smoker    Types: Cigarettes    Last attempt to quit: 07/06/2016    Years since quitting: 2.3  . Smokeless tobacco: Never Used  Substance and Sexual Activity  . Alcohol use: Yes    Alcohol/week: 1.0 standard drinks    Types: 1 Glasses of wine per week    Comment: occasional  . Drug use: Never  . Sexual activity: Not Currently  Lifestyle  . Physical activity:    Days per week: Not on file    Minutes per session: Not on file  . Stress: Not on file  Relationships  . Social connections:    Talks on phone: Not on file    Gets together: Not on file    Attends religious service: Not on file    Active member of club or organization: Not on file    Attends meetings of clubs or organizations: Not on file      Relationship status: Not on file  . Intimate partner violence:    Fear of current or ex partner: Not on file    Emotionally abused: Not on file    Physically abused: Not on file    Forced sexual activity: Not on file  Other Topics Concern  . Not on file  Social History Narrative  . Not on file    Current Outpatient Medications on File Prior to Visit  Medication Sig Dispense Refill  . Ascorbic Acid (VITAMIN C PO) Take 2 each by mouth daily.    . Cyanocobalamin (VITAMIN B-12 PO) Take 1 each by mouth daily.    Marland Kitchen MAGNESIUM PO Take 1,000 mg by mouth at bedtime.    . Omega-3 Fatty Acids (FISH OIL) 1000 MG CAPS Take 2,000 mg by mouth daily.    . Probiotic Product (PROBIOTIC PO) Take 1 capsule by mouth daily.    . valACYclovir (VALTREX) 1000 MG tablet Take 1,000 mg by mouth daily.     . traMADol (ULTRAM) 50 MG tablet Take 1 tablet (50 mg total) by mouth every 6 (six) hours as needed (mild pain). (Patient not  taking: Reported on 10/25/2018) 15 tablet 0   No current facility-administered medications on file prior to visit.     Allergies  Allergen Reactions  . Penicillins Other (See Comments)    Fever convulsion Has patient had a PCN reaction causing immediate rash, facial/tongue/throat swelling, SOB or lightheadedness with hypotension: Unknown Has patient had a PCN reaction causing severe rash involving mucus membranes or skin necrosis: Unknown Has patient had a PCN reaction that required hospitalization: Unknown Has patient had a PCN reaction occurring within the last 10 years: No If all of the above answers are "NO", then may proceed with Cephalosporin use.     Family History  Problem Relation Age of Onset  . Breast cancer Mother 82  . Breast cancer Maternal Grandmother 60    BP 118/78 (BP Location: Right Arm, Patient Position: Sitting, Cuff Size: Normal)   Pulse 98   Ht 5\' 2"  (1.575 m)   Wt 140 lb 12.8 oz (63.9 kg)   SpO2 99%   BMI 25.75 kg/m     Review of  Systems Wound is healing well.      Objective:   Physical Exam VITAL SIGNS:  See vs page GENERAL: no distress Neck: a healed scar is present.  I do not appreciate a nodule in the thyroid or elsewhere in the neck.    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  Patient Instructions  blood tests are requested for you today.  We'll let you know about the results.   When we get the result, I'll refill your medication for 90 days.

## 2018-10-25 NOTE — Patient Instructions (Addendum)
blood tests are requested for you today.  We'll let you know about the results.   When we get the result, I'll refill your medication for 90 days.

## 2018-10-26 MED ORDER — LEVOTHYROXINE SODIUM 150 MCG PO TABS: 150.0000 ug | ORAL_TABLET | Freq: Every day | ORAL | 3 refills | Status: DC

## 2019-04-28 ENCOUNTER — Other Ambulatory Visit: Payer: Self-pay

## 2019-04-28 ENCOUNTER — Ambulatory Visit (INDEPENDENT_AMBULATORY_CARE_PROVIDER_SITE_OTHER): Payer: Managed Care, Other (non HMO) | Admitting: Endocrinology

## 2019-04-28 ENCOUNTER — Encounter: Payer: Self-pay | Admitting: Endocrinology

## 2019-04-28 VITALS — BP 112/68 | HR 71 | Ht 62.0 in | Wt 138.6 lb

## 2019-04-28 DIAGNOSIS — E038 Other specified hypothyroidism: Secondary | ICD-10-CM

## 2019-04-28 DIAGNOSIS — E063 Autoimmune thyroiditis: Secondary | ICD-10-CM | POA: Diagnosis not present

## 2019-04-28 LAB — TSH: TSH: 1.09 u[IU]/mL (ref 0.35–4.50)

## 2019-04-28 LAB — T4, FREE: Free T4: 1.03 ng/dL (ref 0.60–1.60)

## 2019-04-28 NOTE — Progress Notes (Signed)
Subjective:    Patient ID: Veronica Schwartz, female    DOB: [birth year], 53 y.o.   MRN: [id number]  HPI Pt returns for f/u of multinodular thyroid (dx'ed with hypothyroidism in 1983, and the goiter in 2018; she had right lobectomy in 2019 (pathol showed LYMPHOCYTIC THYROIDITIS WITH HURTHLE CELL CHANGE; she requires a full daily dosage of synthroid).  She reports ongoing weight gain.   Past Medical History:  Diagnosis Date  . Hypothyroidism     Past Surgical History:  Procedure Laterality Date  . BREAST BIOPSY Left 2017   benign  . CESAREAN SECTION     2010  . CHEST WALL BIOPSY    . THYROID LOBECTOMY Right 08/17/2018   Procedure: RIGHT THYROID LOBECTOMY;  Surgeon: Armandina Gemma, MD;  Location: [facility];  Service: General;  Laterality: Right;    Social History   Socioeconomic History  . Marital status: Divorced    Spouse name: Not on file  . Number of children: Not on file  . Years of education: Not on file  . Highest education level: Not on file  Occupational History  . Not on file  Social Needs  . Financial resource strain: Not on file  . Food insecurity    Worry: Not on file    Inability: Not on file  . Transportation needs    Medical: Not on file    Non-medical: Not on file  Tobacco Use  . Smoking status: Former Smoker    Types: Cigarettes    Quit date: 07/06/2016    Years since quitting: 2.8  . Smokeless tobacco: Never Used  Substance and Sexual Activity  . Alcohol use: Yes    Alcohol/week: 1.0 standard drinks    Types: 1 Glasses of wine per week    Comment: occasional  . Drug use: Never  . Sexual activity: Not Currently  Lifestyle  . Physical activity    Days per week: Not on file    Minutes per session: Not on file  . Stress: Not on file  Relationships  . Social Herbalist on phone: Not on file    Gets together: Not on file    Attends religious service: Not on file    Active member of club or organization: Not on file    Attends meetings of  clubs or organizations: Not on file    Relationship status: Not on file  . Intimate partner violence    Fear of current or ex partner: Not on file    Emotionally abused: Not on file    Physically abused: Not on file    Forced sexual activity: Not on file  Other Topics Concern  . Not on file  Social History Narrative  . Not on file    Current Outpatient Medications on File Prior to Visit  Medication Sig Dispense Refill  . Ascorbic Acid (VITAMIN C PO) Take 2 each by mouth daily.    . Cyanocobalamin (VITAMIN B-12 PO) Take 1 each by mouth daily.    Marland Kitchen levothyroxine (SYNTHROID, LEVOTHROID) 150 MCG tablet Take 1 tablet (150 mcg total) by mouth daily before breakfast. 90 tablet 3  . MAGNESIUM PO Take 1,000 mg by mouth at bedtime.    . Omega-3 Fatty Acids (FISH OIL) 1000 MG CAPS Take 2,000 mg by mouth daily.    . phentermine 15 MG capsule Take 15 mg by mouth every morning.    . Probiotic Product (PROBIOTIC PO) Take 1 capsule by mouth daily.    Marland Kitchen  traMADol (ULTRAM) 50 MG tablet Take 1 tablet (50 mg total) by mouth every 6 (six) hours as needed (mild pain). 15 tablet 0  . valACYclovir (VALTREX) 1000 MG tablet Take 1,000 mg by mouth daily.      No current facility-administered medications on file prior to visit.      Family History  Problem Relation Age of Onset  . Breast cancer Mother 92  . Breast cancer Maternal Grandmother 60    BP 112/68 (BP Location: Left Arm, Patient Position: Sitting, Cuff Size: Normal)   Pulse 71   Ht 5\' 2"  (1.575 m)   Wt 138 lb 9.6 oz (62.9 kg)   SpO2 98%   BMI 25.35 kg/m    Review of Systems Denies neck swelling and pain.      Objective:   Physical Exam VITAL SIGNS:  See vs page. GENERAL: no distress.  Neck: a healed scar is present.  I do not appreciate a nodule in the thyroid or elsewhere in the neck.    Lab Results  Component Value Date   TSH 1.09 04/28/2019       Assessment & Plan:  Hypothyroidism: well-replaced Weight gain: I told pt if  labs are normal, this is not thyroid-related.   Patient Instructions  Blood tests are requested for you today.  We'll let you know about the results.   Please come back for a follow-up appointment in 1 year.

## 2019-04-28 NOTE — Patient Instructions (Signed)
Blood tests are requested for you today.  We'll let you know about the results.  Please come back for a follow-up appointment in 1 year.    

## 2019-06-02 ENCOUNTER — Ambulatory Visit: Payer: Managed Care, Other (non HMO) | Admitting: Family Medicine

## 2019-06-02 ENCOUNTER — Other Ambulatory Visit: Payer: Self-pay

## 2019-06-02 VITALS — BP 104/66 | Ht 62.0 in | Wt 135.0 lb

## 2019-06-02 DIAGNOSIS — Z711 Person with feared health complaint in whom no diagnosis is made: Secondary | ICD-10-CM

## 2019-06-02 MED ORDER — AZELASTINE HCL 0.1 % NA SOLN: 1.0000 | Freq: Two times a day (BID) | NASAL | 12 refills | Status: DC

## 2019-06-02 NOTE — Progress Notes (Signed)
PCP: Louretta Shorten, MD  Subjective:   HPI: Patient is a 53 y.o. female here due to concern for high heart rate during work out. She mentions that since pandemic, she spends more time for exercise and has started to work out in the gym in past few weeks. She is doing well with exercising, how ever, she noticed her heart rate on fitbit shown at ~180 when on treadmill. She reports sneezing after work out but otherwise asymptomatic and and was able to continue working out without chest pain, dizziness, SOB, headache. She denies Hx of syncope, dyspnea on exertion, palpitation or fatigue.  She is concern about this tachycardia with exercise and came to the office for evaluation. Denies fever, chills, nausea, vomiting, abdominal pain.    Past Medical History:  Diagnosis Date  . Hypothyroidism     Current Outpatient Medications on File Prior to Visit  Medication Sig Dispense Refill  . Ascorbic Acid (VITAMIN C PO) Take 2 each by mouth daily.    . Cyanocobalamin (VITAMIN B-12 PO) Take 1 each by mouth daily.    Marland Kitchen levothyroxine (SYNTHROID, LEVOTHROID) 150 MCG tablet Take 1 tablet (150 mcg total) by mouth daily before breakfast. 90 tablet 3  . MAGNESIUM PO Take 1,000 mg by mouth at bedtime.    . Omega-3 Fatty Acids (FISH OIL) 1000 MG CAPS Take 2,000 mg by mouth daily.    . phentermine 15 MG capsule Take 15 mg by mouth every morning.    . Probiotic Product (PROBIOTIC PO) Take 1 capsule by mouth daily.    . traMADol (ULTRAM) 50 MG tablet Take 1 tablet (50 mg total) by mouth every 6 (six) hours as needed (mild pain). 15 tablet 0  . valACYclovir (VALTREX) 1000 MG tablet Take 1,000 mg by mouth daily.      No current facility-administered medications on file prior to visit.     Past Surgical History:  Procedure Laterality Date  . BREAST BIOPSY Left 2017   benign  . CESAREAN SECTION     2010  . CHEST WALL BIOPSY    . THYROID LOBECTOMY Right 08/17/2018   Procedure: RIGHT THYROID LOBECTOMY;  Surgeon:  Armandina Gemma, MD;  Location: [facility];  Service: General;  Laterality: Right;    Allergies  Allergen Reactions  . Penicillins Other (See Comments)    Fever convulsion Has patient had a PCN reaction causing immediate rash, facial/tongue/throat swelling, SOB or lightheadedness with hypotension: Unknown Has patient had a PCN reaction causing severe rash involving mucus membranes or skin necrosis: Unknown Has patient had a PCN reaction that required hospitalization: Unknown Has patient had a PCN reaction occurring within the last 10 years: No If all of the above answers are "NO", then may proceed with Cephalosporin use.     Social History   Socioeconomic History  . Marital status: Divorced    Spouse name: Not on file  . Number of children: Not on file  . Years of education: Not on file  . Highest education level: Not on file  Occupational History  . Not on file  Social Needs  . Financial resource strain: Not on file  . Food insecurity    Worry: Not on file    Inability: Not on file  . Transportation needs    Medical: Not on file    Non-medical: Not on file  Tobacco Use  . Smoking status: Former Smoker    Types: Cigarettes    Quit date: 07/06/2016    Years since  quitting: 2.9  . Smokeless tobacco: Never Used  Substance and Sexual Activity  . Alcohol use: Yes    Alcohol/week: 1.0 standard drinks    Types: 1 Glasses of wine per week    Comment: occasional  . Drug use: Never  . Sexual activity: Not Currently  Lifestyle  . Physical activity    Days per week: Not on file    Minutes per session: Not on file  . Stress: Not on file  Relationships  . Social Herbalist on phone: Not on file    Gets together: Not on file    Attends religious service: Not on file    Active member of club or organization: Not on file    Attends meetings of clubs or organizations: Not on file    Relationship status: Not on file  . Intimate partner violence    Fear of current or ex  partner: Not on file    Emotionally abused: Not on file    Physically abused: Not on file    Forced sexual activity: Not on file  Other Topics Concern  . Not on file  Social History Narrative  . Not on file    Family History  Problem Relation Age of Onset  . Breast cancer Mother 47  . Breast cancer Maternal Grandmother 60    BP 104/66   Ht 5\' 2"  (1.575 m)   Wt 135 lb (61.2 kg)   BMI 24.69 kg/m   Review of Systems: See HPI above.     Objective:  Physical Exam: Vital sign reviewed. Gen: NAD, comfortable in exam room CV: RRR, Nl S1S2, no murmur, no carotid bruit, no LEE, pulses are normal and symmetric Chest/lungs: CTA bilaterally, no wheezing, no crackle Abdomen: No arterial bruit Psychiatric exam:Slightly anxious, affect is normal    Assessment & Plan:  1. Hx of asymptomatic exercise induced tachycardia: No associated symptoms.  Maximum heart rate for her age is 83 with target of 134. unclear if the heart rate on fitbit was accurate and can be high reading. No episode of tachycardia at rest. -EKG--> Normal sinus rhythm. No evidence of AV block. No ST-T changes Provided reassurance considering having no symptoms and it can be a false brief high reading on fit bit. Stress test can be an option and offered to patient. She agrees to come back to clinic if developed any symptoms. Explained symptoms that warrant ED visits. Patient verbalizes understanding  -Azelastin nasal spray PRN for exercise induced sneezing

## 2019-06-03 NOTE — Progress Notes (Signed)
[place] Attending Note: I have seen and examined this patient. I have discussed this patient with the resident and reviewed the assessment and plan as documented above. I agree with the resident's findings and plan Concerned abouut reading pf 180 for pulse on her FitBit. Asymptomatic EKG normal Reassured Will RTC o\if she develops any symptoms.

## 2019-11-16 ENCOUNTER — Other Ambulatory Visit: Payer: Self-pay | Admitting: Endocrinology

## 2019-12-06 ENCOUNTER — Ambulatory Visit: Payer: Managed Care, Other (non HMO) | Admitting: Nurse Practitioner

## 2019-12-06 ENCOUNTER — Other Ambulatory Visit (INDEPENDENT_AMBULATORY_CARE_PROVIDER_SITE_OTHER): Payer: Managed Care, Other (non HMO)

## 2019-12-06 ENCOUNTER — Encounter: Payer: Self-pay | Admitting: Nurse Practitioner

## 2019-12-06 ENCOUNTER — Other Ambulatory Visit: Payer: Self-pay

## 2019-12-06 VITALS — BP 100/80 | HR 80 | Temp 98.4°F | Ht 62.0 in | Wt 140.0 lb

## 2019-12-06 DIAGNOSIS — R194 Change in bowel habit: Secondary | ICD-10-CM

## 2019-12-06 DIAGNOSIS — R140 Abdominal distension (gaseous): Secondary | ICD-10-CM

## 2019-12-06 DIAGNOSIS — R141 Gas pain: Secondary | ICD-10-CM | POA: Diagnosis not present

## 2019-12-06 LAB — COMPREHENSIVE METABOLIC PANEL
ALT: 12 U/L (ref 0–35)
AST: 19 U/L (ref 0–37)
Albumin: 4.4 g/dL (ref 3.5–5.2)
Alkaline Phosphatase: 27 U/L — ABNORMAL LOW (ref 39–117)
BUN: 19 mg/dL (ref 6–23)
CO2: 28 mEq/L (ref 19–32)
Calcium: 9.6 mg/dL (ref 8.4–10.5)
Chloride: 102 mEq/L (ref 96–112)
Creatinine, Ser: 0.8 mg/dL (ref 0.40–1.20)
GFR: 74.9 mL/min (ref >60.00)
Glucose, Bld: 93 mg/dL (ref 70–99)
Potassium: 4.8 mEq/L (ref 3.5–5.1)
Sodium: 136 mEq/L (ref 135–145)
Total Bilirubin: 0.7 mg/dL (ref 0.2–1.2)
Total Protein: 7 g/dL (ref 6.0–8.3)

## 2019-12-06 LAB — CBC WITH DIFFERENTIAL/PLATELET
Basophils Absolute: 0.1 10*3/uL (ref 0.0–0.1)
Basophils Relative: 0.8 % (ref 0.0–3.0)
Eosinophils Absolute: 0.2 10*3/uL (ref 0.0–0.7)
Eosinophils Relative: 2.2 % (ref 0.0–5.0)
HCT: 41.2 % (ref 36.0–46.0)
Hemoglobin: 14.2 g/dL (ref 12.0–15.0)
Lymphocytes Relative: 26.2 % (ref 12.0–46.0)
Lymphs Abs: 2.1 10*3/uL (ref 0.7–4.0)
MCHC: 34.5 g/dL (ref 30.0–36.0)
MCV: 106.9 fl — ABNORMAL HIGH (ref 78.0–100.0)
Monocytes Absolute: 1 10*3/uL (ref 0.1–1.0)
Monocytes Relative: 12.8 % — ABNORMAL HIGH (ref 3.0–12.0)
Neutro Abs: 4.7 10*3/uL (ref 1.4–7.7)
Neutrophils Relative %: 58 % (ref 43.0–77.0)
Platelets: 284 10*3/uL (ref 150.0–400.0)
RBC: 3.86 Mil/uL — ABNORMAL LOW (ref 3.87–5.11)
RDW: 12.7 % (ref 11.5–15.5)
WBC: 8.1 10*3/uL (ref 4.0–10.5)

## 2019-12-06 LAB — IGA: IgA: 171 mg/dL (ref 68–378)

## 2019-12-06 NOTE — Patient Instructions (Addendum)
If you are age 54 or older, your body mass index should be between 23-30. Your Body mass index is 25.61 kg/m. If this is out of the aforementioned range listed, please consider follow up with your Primary Care Provider.  If you are age 79 or younger, your body mass index should be between 19-25. Your Body mass index is 25.61 kg/m. If this is out of the aformentioned range listed, please consider follow up with your Primary Care Provider.   Your provider has requested that you go to the basement level for lab work before leaving today. Press "B" on the elevator. The lab is located at the first door on the left as you exit the elevator.  Nevin Bloodgood recommends that you complete a bowel purge (to clean out your bowels). Please do the following: Buy one bottle of magnesium citrate and drink as directed.   Follow up with Dr. Tarri Glenn on March 10th  @ 10:30 am.

## 2019-12-06 NOTE — Progress Notes (Signed)
IMPRESSION and PLAN:    # Chronic intermittent bloating / recent bowel changes with constipation / loose, oily stools.  -no weight loss, doubt exocrine pancreatic insuffiencey (she inquired about it) but may be worth trial of enzymes at some point -check basic labs -tTg, IgA for celiac -consider SIBO breath test (if not done with previous workup)  -get EGD / colonoscopy report from [place]. Follow up here in ~3 weeks ( should have records by that time)  # hypothyroidism  -normal TSH in July 2020   # colon cancer screening   -await colonoscopy reports from [place]. Sounds like she had polyps. Describes poor bowel prep. Will determine date of next colonoscopy once records reviewed.   HPI:    Primary GI: New to the practice  Chief complaint : Bloating, gas, bowel changes   Veronica Schwartz is a 54 y.o. female with a pmh significant for hypothyroidism.  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  She was followed by gastroenterologist in [place] for a year or so for bloating, possibly bowel changes.  Her symptoms have never really completely resolved but just wax and wane.  Typically she has 2-3 formed BMs a day but lately has been dealing with constipation requiring Miralax. Now, over the last month when she does have a bowel movement it is oily, loose, and foul-smelling . She inquires about EPI. Patient says she had a colonoscopy at age 31, some polyps may have been removed but the prep was poor.  She was advised to have a 5-year recall. One of her main concerns is that of bloating. Bloating not worse with gluten or any other types of food. She takes a probiotic. Sounds like while in [place] she may have been given a course of Xifaxan but isn't sure and doesn't remember if it was helpful. Patient unsure if ever checked for celiac disease though reports an in [place] within last few years with findings of "the beginning of an  ulcer"  Data Reviewed: no recent labs, hasn't seen PCP in a long time  Review of systems:     No chest pain, no SOB, no fevers, no urinary sx   Past Medical History:  Diagnosis Date  . Hypothyroidism     Patient's surgical history, family medical history, social history, medications and allergies were all reviewed in Epic   Creatinine clearance cannot be calculated (Patient's most recent lab result is older than the maximum 21 days allowed.)  Current Outpatient Medications  Medication Sig Dispense Refill  . azelastine (ASTELIN) 0.1 % nasal spray Place 1 spray into both nostrils 2 (two) times daily. Use in each nostril as directed 30 mL 12  . Cholecalciferol (VITAMIN D) 50 MCG (2000 UT) CAPS Take 1 capsule by mouth daily.    . Cyanocobalamin (VITAMIN B-12 PO) Take 1 each by mouth daily.    Marland Kitchen MAGNESIUM PO Take 1,000 mg by mouth at bedtime.    . Omega-3 Fatty Acids (FISH OIL) 1000 MG CAPS Take 2,000 mg by mouth daily.    . Probiotic Product (PROBIOTIC PO) Take 1 capsule by mouth daily.    Marland Kitchen SYNTHROID 150 MCG tablet TAKE 1 TABLET (150 MCG TOTAL) BY MOUTH DAILY BEFORE BREAKFAST. 90 tablet 3  . valACYclovir (VALTREX) 1000 MG tablet Take 1,000 mg by mouth daily.      No current facility-administered medications for this visit.    Physical Exam:  BP 100/80   Pulse 80   Temp 98.4 F (36.9 C)   Ht 5\' 2"  (1.575 m)   Wt 140 lb (63.5 kg)   LMP 11/23/2019   BMI 25.61 kg/m   GENERAL:  Pleasant female in NAD PSYCH: : Cooperative, normal affect EENT:  conjunctiva pink, mucous membranes moist, neck supple without masses CARDIAC:  RRR, no murmur heard, no peripheral edema PULM: Normal respiratory effort, lungs CTA bilaterally, no wheezing ABDOMEN:  Nondistended, soft, nontender. No obvious masses, no hepatomegaly,  normal bowel sounds SKIN:  turgor, no lesions seen Musculoskeletal:  Normal muscle tone, normal strength NEURO: Alert and oriented x 3, no focal neurologic  deficits   Veronica Schwartz , NP 12/06/2019, 9:50 AM

## 2019-12-07 ENCOUNTER — Encounter: Payer: Self-pay | Admitting: Nurse Practitioner

## 2019-12-08 NOTE — Progress Notes (Signed)
Reviewed and agree with management plans. ? ?Antia Rahal L. Shalimar Mcclain, MD, MPH  ?

## 2019-12-25 ENCOUNTER — Ambulatory Visit: Payer: Managed Care, Other (non HMO) | Admitting: Family

## 2019-12-25 ENCOUNTER — Other Ambulatory Visit: Payer: Self-pay

## 2019-12-25 ENCOUNTER — Encounter: Payer: Self-pay | Admitting: Family

## 2019-12-25 VITALS — BP 104/70 | HR 69 | Temp 98.7°F | Ht 62.0 in | Wt 139.8 lb

## 2019-12-25 DIAGNOSIS — Z8249 Family history of ischemic heart disease and other diseases of the circulatory system: Secondary | ICD-10-CM | POA: Diagnosis not present

## 2019-12-25 DIAGNOSIS — E538 Deficiency of other specified B group vitamins: Secondary | ICD-10-CM | POA: Diagnosis not present

## 2019-12-25 DIAGNOSIS — R000 Tachycardia, unspecified: Secondary | ICD-10-CM | POA: Diagnosis not present

## 2019-12-25 DIAGNOSIS — Z13220 Encounter for screening for lipoid disorders: Secondary | ICD-10-CM | POA: Diagnosis not present

## 2019-12-25 DIAGNOSIS — E038 Other specified hypothyroidism: Secondary | ICD-10-CM

## 2019-12-25 DIAGNOSIS — Z23 Encounter for immunization: Secondary | ICD-10-CM | POA: Diagnosis not present

## 2019-12-25 DIAGNOSIS — E063 Autoimmune thyroiditis: Secondary | ICD-10-CM

## 2019-12-25 DIAGNOSIS — E041 Nontoxic single thyroid nodule: Secondary | ICD-10-CM

## 2019-12-25 LAB — COMPREHENSIVE METABOLIC PANEL
ALT: 14 U/L (ref 0–35)
AST: 24 U/L (ref 0–37)
Albumin: 4.5 g/dL (ref 3.5–5.2)
Alkaline Phosphatase: 31 U/L — ABNORMAL LOW (ref 39–117)
BUN: 15 mg/dL (ref 6–23)
CO2: 30 mEq/L (ref 19–32)
Calcium: 9.8 mg/dL (ref 8.4–10.5)
Chloride: 102 mEq/L (ref 96–112)
Creatinine, Ser: 0.76 mg/dL (ref 0.40–1.20)
GFR: 79.45 mL/min (ref >60.00)
Glucose, Bld: 95 mg/dL (ref 70–99)
Potassium: 4.5 mEq/L (ref 3.5–5.1)
Sodium: 140 mEq/L (ref 135–145)
Total Bilirubin: 0.6 mg/dL (ref 0.2–1.2)
Total Protein: 7.4 g/dL (ref 6.0–8.3)

## 2019-12-25 LAB — LIPID PANEL
Cholesterol: 213 mg/dL — ABNORMAL HIGH (ref 0–200)
HDL: 83.5 mg/dL (ref >39.00)
LDL Cholesterol: 113 mg/dL — ABNORMAL HIGH (ref 0–99)
NonHDL: 129.69
Total CHOL/HDL Ratio: 3
Triglycerides: 83 mg/dL (ref 0.0–149.0)
VLDL: 16.6 mg/dL (ref 0.0–40.0)

## 2019-12-25 LAB — FOLATE: Folate: >23.7 ng/mL (ref >5.9)

## 2019-12-25 LAB — VITAMIN B12: Vitamin B-12: 364 pg/mL (ref 211–911)

## 2019-12-25 NOTE — Progress Notes (Signed)
Veronica Schwartz is a 54 y.o. female with the following history as recorded in EpicCare:  Patient Active Problem List   Diagnosis Date Noted  . Thyroid nodule 08/17/2018  . Hypothyroidism   . Thyroid mass 05/17/2018    Current Outpatient Medications  Medication Sig Dispense Refill  . azelastine (ASTELIN) 0.1 % nasal spray Place 1 spray into both nostrils 2 (two) times daily. Use in each nostril as directed 30 mL 12  . Cholecalciferol (VITAMIN D) 50 MCG (2000 UT) CAPS Take 1 capsule by mouth daily.    . Cyanocobalamin (VITAMIN B-12 PO) Take 1 each by mouth daily.    Marland Kitchen MAGNESIUM PO Take 1,000 mg by mouth at bedtime.    . Omega-3 Fatty Acids (FISH OIL) 1000 MG CAPS Take 2,000 mg by mouth daily.    . Probiotic Product (PROBIOTIC PO) Take 1 capsule by mouth daily.    Marland Kitchen SYNTHROID 150 MCG tablet TAKE 1 TABLET (150 MCG TOTAL) BY MOUTH DAILY BEFORE BREAKFAST. 90 tablet 3  . valACYclovir (VALTREX) 1000 MG tablet Take 1,000 mg by mouth daily.      No current facility-administered medications for this visit.    Allergies: Penicillins  Past Medical History:  Diagnosis Date  . Hypothyroidism     Past Surgical History:  Procedure Laterality Date  . BREAST BIOPSY Left 2017   benign  . CESAREAN SECTION     2010  . CHEST WALL BIOPSY    . THYROID LOBECTOMY Right 08/17/2018   Procedure: RIGHT THYROID LOBECTOMY;  Surgeon: Armandina Gemma, MD;  Location: [facility];  Service: General;  Laterality: Right;    Family History  Problem Relation Age of Onset  . Breast cancer Mother 87  . Breast cancer Maternal Grandmother 31    Social History   Tobacco Use  . Smoking status: Former Smoker    Types: Cigarettes    Quit date: 07/06/2016    Years since quitting: 3.4  . Smokeless tobacco: Never Used  Substance Use Topics  . Alcohol use: Yes    Alcohol/week: 1.0 standard drinks    Types: 1 Glasses of wine per week    Comment: occasional    Subjective:  Presents today as a new patient; would like to get  fasting labs updated; Under care of endocrine and GYN- Dr. Corinna Capra ([facility]); scheduled to see GI later this week; Needs updated imaging for abdominal aorta irregularity- size of aorta is "larger than normal." History of elevated heart rate on Apple Watch with exercise- heart rate can go as high as 200;   Health Maintenance  Topic Date Due  . TETANUS/TDAP  06/16/1985  . MAMMOGRAM  01/19/2020  . PAP SMEAR-Modifier  12/22/2020  . COLONOSCOPY  04/02/2022  . INFLUENZA VACCINE  Completed  . HIV Screening  Completed      Objective:  Vitals:   12/25/19 0946  BP: 104/70  Pulse: 69  Temp: 98.7 F (37.1 C)  TempSrc: Oral  SpO2: 99%  Weight: 139 lb 12.8 oz (63.4 kg)  Height: '5\' 2"'  (1.575 m)    General: Well developed, well nourished, in no acute distress  Skin : Warm and dry.  Head: Normocephalic and atraumatic  Lungs: Respirations unlabored; clear to auscultation bilaterally without wheeze, rales, rhonchi  CVS exam: normal rate and regular rhythm.  Neurologic: Alert and oriented; speech intact; face symmetrical; moves all extremities well; CNII-XII intact without focal deficit   Assessment:  1. Family history of abdominal aortic aneurysm (AAA)  2. B12 deficiency   3. Lipid screening   4. Tachycardia   5. Hypothyroidism due to Hashimoto's thyroiditis   6. Thyroid nodule     Plan:  1. Order for imaging updated; 2. Check B12, folate levels; 3. Check lipid panel; 4. Refer to cardiology; 5. & 6. Continue with endocrine as scheduled;  This visit occurred during the SARS-CoV-2 public health emergency.  Safety protocols were in place, including screening questions prior to the visit, additional usage of staff PPE, and extensive cleaning of exam room while observing appropriate contact time as indicated for disinfecting solutions.     No follow-ups on file.  Orders Placed This Encounter  Procedures  . Comp Met (CMET)  . Lipid panel  . B12  . Folate  . Ambulatory  referral to Cardiology    Referral Priority:   Routine    Referral Type:   Consultation    Referral Reason:   Specialty Services Required    Requested Specialty:   Cardiology    Number of Visits Requested:   1    Requested Prescriptions    No prescriptions requested or ordered in this encounter

## 2019-12-25 NOTE — Addendum Note (Signed)
Addended by: Marcina Millard on: 12/25/2019 03:41 PM   Modules accepted: Orders

## 2019-12-27 ENCOUNTER — Encounter: Payer: Self-pay | Admitting: Gastroenterology

## 2019-12-27 ENCOUNTER — Ambulatory Visit: Payer: Managed Care, Other (non HMO) | Admitting: Gastroenterology

## 2019-12-27 ENCOUNTER — Other Ambulatory Visit: Payer: Self-pay

## 2019-12-27 VITALS — BP 102/60 | HR 72 | Temp 97.2°F | Ht 62.0 in | Wt 143.6 lb

## 2019-12-27 DIAGNOSIS — R140 Abdominal distension (gaseous): Secondary | ICD-10-CM | POA: Diagnosis not present

## 2019-12-27 DIAGNOSIS — R194 Change in bowel habit: Secondary | ICD-10-CM

## 2019-12-27 NOTE — Progress Notes (Signed)
Referring Provider: Louretta Shorten, MD Primary Care Physician:  Marrian Salvage, [place]  Chief complaint:  bloating   IMPRESSION:  Bloating Change in bowel habits Recent unintentional weight gain Normal IgA History of colon polyps with poor bowel prep on colonoscopy in [place]   PLAN: TTGA to evaluate for celiac (if not included in evaluation from [place]) Stool for fecal elastase to screen for EPI Trial of FDGuard SIBO breath test Obtain records from [place] including EGD and colonoscopy reports Follow-up after the SIBO breath test Will determine interval for colonoscopy after review records from [place]  Please see the "Patient Instructions" section for addition details about the plan.  I spent 35 minutes of time, including in depth chart review, independent review of results as outlined above, communicating results with the patient directly, face-to-face time with the patient, coordinating care, and ordering studies and medications as appropriate, and documentation.  HPI: Veronica Schwartz is a 54 y.o. female seen in consultation by [name] 12/06/19. This is my first visit with Veronica Schwartz. 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 Moved here from [place] 3 years ago. He has hypothyroidism.   One month of chronic, intermittent bloating, recent change in bowel changes with constipation, and noted loose, oily, foul smelling stools. Associated 5 pound weight gain despite strict diet and regular exercise, early satiety, and nausea. Finds bloating worsens over the course of a day. Symptoms improve with defecation. Postprandial diarrhea. No relationship to eating or movement.  Denies constipation.  Vegetarian. No fried foods. No identified food triggers. No change with dairy or exposure to gluten. Sugar alcohols worsen her symptoms.  Taking a daily probiotic. Drinks Kombucha regularly.   History of similar symptoms in [place] several years  ago. Occurred around the time that she quit smoking. Had an EGD and colonoscopy. Polyps may have been removed. The prep was poor. She remembers being told to have another colonoscopy in 5 years.  Treatment at that time included "wiping out" her bacteria. Can't remember the details of her treatment or if it helped.   She doesn't feel that it's constipation. Doesn't feel that she is stressed.   Severe acid reflux over the summer that she attributes to jumping up and down with exercise.   Family history of PUD. No known family history of colon cancer or polyps. No family history of uterine/endometrial cancer, pancreatic cancer or gastric/stomach cancer.  Labs from 12/06/19: normal CMP, IgA, CBC, B12 and folate TTGA ordered 12/06/19 but not performed   Past Medical History:  Diagnosis Date  . Hypothyroidism     Past Surgical History:  Procedure Laterality Date  . BREAST BIOPSY Left 2017   benign  . CESAREAN SECTION     2010  . CHEST WALL BIOPSY    . THYROID LOBECTOMY Right 08/17/2018   Procedure: RIGHT THYROID LOBECTOMY;  Surgeon: Armandina Gemma, MD;  Location: [facility];  Service: General;  Laterality: Right;    Current Outpatient Medications  Medication Sig Dispense Refill  . azelastine (ASTELIN) 0.1 % nasal spray Place 1 spray into both nostrils 2 (two) times daily. Use in each nostril as directed 30 mL 12  . Cholecalciferol (VITAMIN D) 50 MCG (2000 UT) CAPS Take 1 capsule by mouth daily.    . Cyanocobalamin (VITAMIN B-12 PO) Take 1 each by mouth daily.    Marland Kitchen MAGNESIUM PO Take 1,000 mg by mouth at bedtime.    . Omega-3 Fatty Acids (FISH OIL)  1000 MG CAPS Take 2,000 mg by mouth daily.    . Probiotic Product (PROBIOTIC PO) Take 1 capsule by mouth daily.    Marland Kitchen SYNTHROID 150 MCG tablet TAKE 1 TABLET (150 MCG TOTAL) BY MOUTH DAILY BEFORE BREAKFAST. 90 tablet 3  . valACYclovir (VALTREX) 1000 MG tablet Take 1,000 mg by mouth daily.      No current facility-administered medications for this  visit.    Allergies as of 12/27/2019 - Review Complete 12/27/2019  Allergen Reaction Noted  . Penicillins Other (See Comments) 05/17/2018    Family History  Problem Relation Age of Onset  . Breast cancer Mother 49  . Breast cancer Maternal Grandmother 47    Social History   Socioeconomic History  . Marital status: Divorced    Spouse name: Not on file  . Number of children: Not on file  . Years of education: Not on file  . Highest education level: Not on file  Occupational History  . Not on file  Tobacco Use  . Smoking status: Former Smoker    Types: Cigarettes    Quit date: 07/06/2016    Years since quitting: 3.4  . Smokeless tobacco: Never Used  Substance and Sexual Activity  . Alcohol use: Yes    Alcohol/week: 1.0 standard drinks    Types: 1 Glasses of wine per week    Comment: occasional  . Drug use: Never  . Sexual activity: Not Currently  Other Topics Concern  . Not on file  Social History Narrative  . Not on file   Social Determinants of Health   Financial Resource Strain:   . Difficulty of Paying Living Expenses: Not on file  Food Insecurity:   . Worried About Charity fundraiser in the Last Year: Not on file  . Ran Out of Food in the Last Year: Not on file  Transportation Needs:   . Lack of Transportation (Medical): Not on file  . Lack of Transportation (Non-Medical): Not on file  Physical Activity:   . Days of Exercise per Week: Not on file  . Minutes of Exercise per Session: Not on file  Stress:   . Feeling of Stress : Not on file  Social Connections:   . Frequency of Communication with Friends and Family: Not on file  . Frequency of Social Gatherings with Friends and Family: Not on file  . Attends Religious Services: Not on file  . Active Member of Clubs or Organizations: Not on file  . Attends Archivist Meetings: Not on file  . Marital Status: Not on file  Intimate Partner Violence:   . Fear of Current or Ex-Partner: Not on file    . Emotionally Abused: Not on file  . Physically Abused: Not on file  . Sexually Abused: Not on file    Review of Systems: 12 system ROS is negative except as noted above.   Physical Exam: General:   Alert,  well-nourished, pleasant and cooperative in NAD Head:  Normocephalic and atraumatic. Eyes:  Sclera clear, no icterus.   Conjunctiva pink. Ears:  Normal auditory acuity. Nose:  No deformity, discharge,  or lesions. Mouth:  No deformity or lesions.   Neck:  Supple; no masses or thyromegaly. Lungs:  Clear throughout to auscultation.   No wheezes. Heart:  Regular rate and rhythm; no murmurs. Abdomen:  Soft,nontender, nondistended, normal bowel sounds, no rebound or guarding. No hepatosplenomegaly.   Rectal:  Deferred  Msk:  Symmetrical. No boney deformities LAD: No inguinal or  umbilical LAD Extremities:  No clubbing or edema. Neurologic:  Alert and  oriented x4;  grossly nonfocal Skin:  Intact without significant lesions or rashes. Psych:  Alert and cooperative. Normal mood and affect.   Lab Results: No results for input(s): WBC, HGB, HCT, PLT in the last 72 hours. BMET Recent Labs    12/25/19 1031  NA 140  K 4.5  CL 102  CO2 30  GLUCOSE 95  BUN 15  CREATININE 0.76  CALCIUM 9.8   LFT Recent Labs    12/25/19 1031  PROT 7.4  ALBUMIN 4.5  AST 24  ALT 14  ALKPHOS 31*  BILITOT 0.6     Emmagrace Runkel L. Tarri Glenn, MD, MPH 12/27/2019, 10:56 AM

## 2019-12-27 NOTE — Patient Instructions (Addendum)
We have given you a trial of FD guard. Take as directed.   You have been given a testing kit to check for small intestine bacterial overgrowth (SIBO) which is completed by a company named [facility]. Make sure to return your test in the mail using the return mailing label given you along with the kit. Your demographic and insurance information have already been sent to the company and they should be in contact with you over the next week regarding this test. Make sure to discuss with [facility] PRIOR to having the test if they have gotten informatoin from your insurance company as to how much your testing will cost out of pocket, if any. Please keep in mind that you will be getting a call from phone number [phone] or a similar number. If you do not hear from them within this time frame, please call our office at [phone].

## 2019-12-29 ENCOUNTER — Encounter: Payer: Self-pay | Admitting: Gastroenterology

## 2020-01-02 ENCOUNTER — Other Ambulatory Visit: Payer: Self-pay | Admitting: Family

## 2020-01-02 DIAGNOSIS — I7789 Other specified disorders of arteries and arterioles: Secondary | ICD-10-CM

## 2020-01-02 DIAGNOSIS — Z8249 Family history of ischemic heart disease and other diseases of the circulatory system: Secondary | ICD-10-CM

## 2020-01-03 ENCOUNTER — Ambulatory Visit: Payer: Managed Care, Other (non HMO) | Admitting: Cardiology

## 2020-01-03 ENCOUNTER — Encounter: Payer: Self-pay | Admitting: Cardiology

## 2020-01-03 ENCOUNTER — Other Ambulatory Visit: Payer: Self-pay

## 2020-01-03 ENCOUNTER — Encounter: Payer: Self-pay | Admitting: *Deleted

## 2020-01-03 VITALS — BP 111/70 | HR 63 | Temp 97.3°F | Ht 62.0 in | Wt 143.8 lb

## 2020-01-03 DIAGNOSIS — Z7189 Other specified counseling: Secondary | ICD-10-CM | POA: Diagnosis not present

## 2020-01-03 DIAGNOSIS — Z87891 Personal history of nicotine dependence: Secondary | ICD-10-CM | POA: Diagnosis not present

## 2020-01-03 DIAGNOSIS — R000 Tachycardia, unspecified: Secondary | ICD-10-CM | POA: Insufficient documentation

## 2020-01-03 DIAGNOSIS — Z8249 Family history of ischemic heart disease and other diseases of the circulatory system: Secondary | ICD-10-CM | POA: Diagnosis not present

## 2020-01-03 DIAGNOSIS — E0580 Other thyrotoxicosis without thyrotoxic crisis or storm: Secondary | ICD-10-CM | POA: Insufficient documentation

## 2020-01-03 NOTE — Patient Instructions (Signed)
Medication Instructions:  Your Physician recommend you continue on your current medication as directed.    *If you need a refill on your cardiac medications before your next appointment, please call your pharmacy*   Lab Work: None   Testing/Procedures: Calcium Score  [address] has recommended that you wear an 7  DAY ZIO-PATCH monitor. The Zio patch cardiac monitor continuously records heart rhythm data for up to 14 days, this is for patients being evaluated for multiple types heart rhythms. For the first 24 hours post application, please avoid getting the Zio monitor wet in the shower or by excessive sweating during exercise. After that, feel free to carry on with regular activities. Keep soaps and lotions away from the ZIO XT Patch.   Someone will call to mail monitor.       Follow-Up: At [facility], you and your health needs are our priority.  As part of our continuing mission to provide you with exceptional heart care, we have created designated Provider Care Teams.  These Care Teams include your primary Cardiologist (physician) and Advanced Practice Providers (APPs -  Physician Assistants and Nurse Practitioners) who all work together to provide you with the care you need, when you need it.  We recommend signing up for the patient portal called "MyChart".  Sign up information is provided on this After Visit Summary.  MyChart is used to connect with patients for Virtual Visits (Telemedicine).  Patients are able to view lab/test results, encounter notes, upcoming appointments, etc.  Non-urgent messages can be sent to your provider as well.   To learn more about what you can do with MyChart, go to [email or web address].    Your next appointment:   As needed  The format for your next appointment:   Either In Person or Virtual  Provider:   Buford Dresser, MD  [place] Instructions   Your physician has requested you wear your  ZIO patch monitor__7_____days.   This is a single patch monitor.  [employer] supplies one patch monitor per enrollment.  Additional stickers are not available.   Please do not apply patch if you will be having a Nuclear Stress Test, Echocardiogram, Cardiac CT, MRI, or Chest Xray during the time frame you would be wearing the monitor. The patch cannot be worn during these tests.  You cannot remove and re-apply the ZIO XT patch monitor.   Your ZIO patch monitor will be sent USPS Priority mail from [facility] directly to your home address. The monitor may also be mailed to a PO BOX if home delivery is not available.   It may take 3-5 days to receive your monitor after you have been enrolled.   Once you have received you monitor, please review enclosed instructions.  Your monitor has already been registered assigning a specific monitor serial # to you.   Applying the monitor   Shave hair from upper left chest.   Hold abrader disc by orange tab.  Rub abrader in 40 strokes over left upper chest as indicated in your monitor instructions.   Clean area with 4 enclosed alcohol pads .  Use all pads to assure are is cleaned thoroughly.  Let dry.   Apply patch as indicated in monitor instructions.  Patch will be place under collarbone on left side of chest with arrow pointing upward.   Rub patch adhesive wings for 2 minutes.Remove white label marked "1".  Remove white label marked "2".  Rub patch  adhesive wings for 2 additional minutes.   While looking in a mirror, press and release button in center of patch.  A small green light will flash 3-4 times .  This will be your only indicator the monitor has been turned on.     Do not shower for the first 24 hours.  You may shower after the first 24 hours.   Press button if you feel a symptom. You will hear a small click.  Record Date, Time and Symptom in the Patient Log Book.   When you are ready to remove patch, follow instructions on last 2 pages  of Patient Log Book.  Stick patch monitor onto last page of Patient Log Book.   Place Patient Log Book in [place] box.  Use locking tab on box and tape box closed securely.  The [employer] and [employer] has [employer] on it.  Please place in mailbox as soon as possible.  Your physician should have your test results approximately 7 days after the monitor has been mailed back to [facility].   Call [place] at [phone] if you have questions regarding your ZIO XT patch monitor.  Call them immediately if you see an orange light blinking on your monitor.   If your monitor falls off in less than 4 days contact our Monitor department at [phone].  If your monitor becomes loose or falls off after 4 days call [employer] at [phone] for suggestions on securing your monitor.

## 2020-01-03 NOTE — Progress Notes (Signed)
Cardiology Office Note:    Date:  01/03/2020   ID:  Veronica Schwartz, DOB 1965-10-24, MRN [id number]  PCP:  Marrian Salvage, [place]  Cardiologist:  Buford Dresser, MD  Referring MD: Marrian Salvage,*   CC: new patient consultation for the evaluation and management of tachycardia  History of Present Illness:    Veronica Schwartz is a 54 y.o. female with a hx of hypothyroidism after thyroid lobectomy for thyroid mass who is seen as a new consult at the request of Marrian Salvage,* for the evaluation and management of tachycardia.  Note from 12/25/19 with Marrian Salvage, [place] reviewed. Noted to have history of elevated heart rate on apple watch, up to 200 bpm. Referred for further evaluation.  Tachycardia/palpitations: -Initial onset: used to do crossfit, didn't notice high heart rates until she started doing HIIT on the treadmill. Does not feel bad with these events, not out of breath, no pain. -Frequency/Duration: every time she does HIIT, never without exercise -Associated symptoms: none -Aggravating/alleviating factors: only  -Syncope/near syncope: none -Prior cardiac history: none -Prior ECG: none prior to day -Prior workup: none -Prior treatment: none -Possible medication interactions: -Caffeine: 7-8 cups of coffee every morning -Alcohol: wine/beer daily, up to 2-3 glasses/day. -Tobacco: quit for good when she was 50, never more than 1 ppd but usually 10-15 cigarettes/day -OTC supplements: nicotine gum -Comorbidities:  She is a dietician, very fit/active. -Exercise level: highly active, crossfit in the past, high intensity interval training -Labs: TSH, kidney function/electrolytes, CBC reviewed. -Cardiac ROS: no chest pain, no shortness of breath, no PND, no orthopnea, no LE edema. -Family history: mother had lung cancer but died of MI at age 26. Grandmother had stroke, very high blood pressure. Father still alive, smokes 2 ppd.  Past Medical History:    Diagnosis Date  . Hypothyroidism     Past Surgical History:  Procedure Laterality Date  . BREAST BIOPSY Left 2017   benign  . CESAREAN SECTION     2010  . CHEST WALL BIOPSY    . THYROID LOBECTOMY Right 08/17/2018   Procedure: RIGHT THYROID LOBECTOMY;  Surgeon: Armandina Gemma, MD;  Location: [facility];  Service: General;  Laterality: Right;    Current Medications: Current Outpatient Medications on File Prior to Visit  Medication Sig  . azelastine (ASTELIN) 0.1 % nasal spray Place 1 spray into both nostrils 2 (two) times daily. Use in each nostril as directed  . Cholecalciferol (VITAMIN D) 50 MCG (2000 UT) CAPS Take 1 capsule by mouth daily.  . Cyanocobalamin (VITAMIN B-12 PO) Take 1 each by mouth daily.  Marland Kitchen MAGNESIUM PO Take 1,000 mg by mouth at bedtime.  . Omega-3 Fatty Acids (FISH OIL) 1000 MG CAPS Take 2,000 mg by mouth daily.  . Probiotic Product (PROBIOTIC PO) Take 1 capsule by mouth daily.  Marland Kitchen SYNTHROID 150 MCG tablet TAKE 1 TABLET (150 MCG TOTAL) BY MOUTH DAILY BEFORE BREAKFAST.  . valACYclovir (VALTREX) 1000 MG tablet Take 1,000 mg by mouth daily.    No current facility-administered medications on file prior to visit.     Allergies:   Penicillins   Social History   Tobacco Use  . Smoking status: Former Smoker    Types: Cigarettes    Quit date: 07/06/2016    Years since quitting: 3.4  . Smokeless tobacco: Never Used  Substance Use Topics  . Alcohol use: Yes    Alcohol/week: 1.0 standard drinks    Types: 1 Glasses of wine per week  Comment: occasional  . Drug use: Never    Family History: family history includes Breast cancer (age of onset: 55) in her maternal grandmother and mother.  ROS:   Please see the history of present illness.  Additional pertinent ROS: Constitutional: Negative for chills, fever, night sweats, unintentional weight loss  HENT: Negative for ear pain and hearing loss.   Eyes: Negative for loss of vision and eye pain.  Respiratory: Negative  for cough, sputum, wheezing.   Cardiovascular: See HPI. Gastrointestinal: Negative for abdominal pain, melena, and hematochezia.  Genitourinary: Negative for dysuria and hematuria.  Musculoskeletal: Negative for falls and myalgias.  Skin: Negative for itching and rash.  Neurological: Negative for focal weakness, focal sensory changes and loss of consciousness.  Endo/Heme/Allergies: Does not bruise/bleed easily.     EKGs/Labs/Other Studies Reviewed:    The following studies were reviewed today: No prior cardiac studies available  EKG:  EKG is personally reviewed.  The ekg ordered today demonstrates sinus bradycardia at 52 bpm  Recent Labs: 04/28/2019: TSH 1.09 12/06/2019: Hemoglobin 14.2; Platelets 284.0 12/25/2019: ALT 14; BUN 15; Creatinine, Ser 0.76; Potassium 4.5; Sodium 140  Recent Lipid Panel    Component Value Date/Time   CHOL 213 (H) 12/25/2019 1031   TRIG 83.0 12/25/2019 1031   HDL 83.50 12/25/2019 1031   CHOLHDL 3 12/25/2019 1031   VLDL 16.6 12/25/2019 1031   LDLCALC 113 (H) 12/25/2019 1031    Physical Exam:    VS:  BP 111/70 (BP Location: Left Arm)   Pulse 63   Temp (!) 97.3 F (36.3 C)   Ht 5\' 2"  (1.575 m)   Wt 143 lb 12.8 oz (65.2 kg)   SpO2 100%   BMI 26.30 kg/m     Wt Readings from Last 3 Encounters:  01/03/20 143 lb 12.8 oz (65.2 kg)  12/27/19 143 lb 9.6 oz (65.1 kg)  12/25/19 139 lb 12.8 oz (63.4 kg)    GEN: Well nourished, well developed in no acute distress HEENT: Normal, moist mucous membranes NECK: No JVD CARDIAC: regular rhythm, normal S1 and S2, no rubs or gallops. No murmurs. VASCULAR: Radial and DP pulses 2+ bilaterally. No carotid bruits RESPIRATORY:  Clear to auscultation without rales, wheezing or rhonchi  ABDOMEN: Soft, non-tender, non-distended MUSCULOSKELETAL:  Ambulates independently SKIN: Warm and dry, no edema NEUROLOGIC:  Alert and oriented x 3. No focal neuro deficits noted. PSYCHIATRIC:  Normal affect    ASSESSMENT:    1.  Tachycardia   2. Family history of heart disease   3. Former tobacco use   4. Cardiac risk counseling   5. Counseling on health promotion and disease prevention    PLAN:    Tachycardia: we discussed the difference between sinus tachycardia due to exercise and arrhythmia. She has not had any episodes except when pushing herself hard with exercise -7 day Zio monitor ordered to evaluate for arrhythmia -we did discuss guideline of 220-age for peak heart rate with exercise, but she is completely asymptomatic with heavy exertion. She will use monitor results to guide her future exercise intensity  Family history of heart disease, prior smoker: We discussed how we assess risk for this. Her largest risk is smoking. She notes prior issues with calcification -we discussed the data regarding calcium scores today, including how it can risk stratify with mortality -we discussed that she is overall low risk based on her calculator and lipids, but this does not account for the duration of smoking or her family history -after discussion,  she wishes to proceed with coronary calcium score for further assessment  Cardiac risk counseling and prevention recommendations: -recommend heart healthy/Mediterranean diet, with whole grains, fruits, vegetable, fish, lean meats, nuts, and olive oil. Limit salt. -recommend moderate walking, 3-5 times/week for 30-50 minutes each session. Aim for at least 150 minutes.week. Goal should be pace of 3 miles/hours, or walking 1.5 miles in 30 minutes -recommend avoidance of tobacco products. Avoid excess alcohol. -ASCVD risk score: The 10-year ASCVD risk score Mikey Bussing DC Brooke Bonito., et al., 2013) is: 0.9%   Values used to calculate the score:     Age: 13 years     Sex: Female     Is Non-Hispanic African American: No     Diabetic: No     Tobacco smoker: No     Systolic Blood Pressure: 99991111 mmHg     Is BP treated: No     HDL Cholesterol: 83.5 mg/dL     Total Cholesterol: 213 mg/dL     Plan for follow up: if testing is normal, can follow up as needed  Buford Dresser, MD, PhD [place]  [facility]    Medication Adjustments/Labs and Tests Ordered: Current medicines are reviewed at length with the patient today.  Concerns regarding medicines are outlined above.  Orders Placed This Encounter  Procedures  . CT CARDIAC SCORING  . LONG TERM MONITOR (3-14 DAYS)  . EKG 12-Lead   No orders of the defined types were placed in this encounter.   Patient Instructions  Medication Instructions:  Your Physician recommend you continue on your current medication as directed.    *If you need a refill on your cardiac medications before your next appointment, please call your pharmacy*   Lab Work: None   Testing/Procedures: Calcium Score  [address] [place] has recommended that you wear an 7  DAY ZIO-PATCH monitor. The Zio patch cardiac monitor continuously records heart rhythm data for up to 14 days, this is for patients being evaluated for multiple types heart rhythms. For the first 24 hours post application, please avoid getting the Zio monitor wet in the shower or by excessive sweating during exercise. After that, feel free to carry on with regular activities. Keep soaps and lotions away from the ZIO XT Patch.   Someone will call to mail monitor.       Follow-Up: At [facility], you and your health needs are our priority.  As part of our continuing mission to provide you with exceptional heart care, we have created designated Provider Care Teams.  These Care Teams include your primary Cardiologist (physician) and Advanced Practice Providers (APPs -  Physician Assistants and Nurse Practitioners) who all work together to provide you with the care you need, when you need it.  We recommend signing up for the patient portal called "MyChart".  Sign up information is provided on this After Visit Summary.  MyChart is used to connect with  patients for Virtual Visits (Telemedicine).  Patients are able to view lab/test results, encounter notes, upcoming appointments, etc.  Non-urgent messages can be sent to your provider as well.   To learn more about what you can do with MyChart, go to [email or web address].    Your next appointment:   As needed  The format for your next appointment:   Either In Person or Virtual  Provider:   Buford Dresser, MD  [place] Instructions   Your physician has requested you wear your ZIO patch monitor__7_____days.  This is a single patch monitor.  [employer] supplies one patch monitor per enrollment.  Additional stickers are not available.   Please do not apply patch if you will be having a Nuclear Stress Test, Echocardiogram, Cardiac CT, MRI, or Chest Xray during the time frame you would be wearing the monitor. The patch cannot be worn during these tests.  You cannot remove and re-apply the ZIO XT patch monitor.   Your ZIO patch monitor will be sent USPS Priority mail from [facility] directly to your home address. The monitor may also be mailed to a PO BOX if home delivery is not available.   It may take 3-5 days to receive your monitor after you have been enrolled.   Once you have received you monitor, please review enclosed instructions.  Your monitor has already been registered assigning a specific monitor serial # to you.   Applying the monitor   Shave hair from upper left chest.   Hold abrader disc by orange tab.  Rub abrader in 40 strokes over left upper chest as indicated in your monitor instructions.   Clean area with 4 enclosed alcohol pads .  Use all pads to assure are is cleaned thoroughly.  Let dry.   Apply patch as indicated in monitor instructions.  Patch will be place under collarbone on left side of chest with arrow pointing upward.   Rub patch adhesive wings for 2 minutes.Remove white label marked "1".  Remove white label marked "2".  Rub  patch adhesive wings for 2 additional minutes.   While looking in a mirror, press and release button in center of patch.  A small green light will flash 3-4 times .  This will be your only indicator the monitor has been turned on.     Do not shower for the first 24 hours.  You may shower after the first 24 hours.   Press button if you feel a symptom. You will hear a small click.  Record Date, Time and Symptom in the Patient Log Book.   When you are ready to remove patch, follow instructions on last 2 pages of Patient Log Book.  Stick patch monitor onto last page of Patient Log Book.   Place Patient Log Book in [place] box.  Use locking tab on box and tape box closed securely.  The [employer] has [employer] on it.  Please place in mailbox as soon as possible.  Your physician should have your test results approximately 7 days after the monitor has been mailed back to [facility].   Call [place] at [phone] if you have questions regarding your ZIO XT patch monitor.  Call them immediately if you see an orange light blinking on your monitor.   If your monitor falls off in less than 4 days contact our Monitor department at [phone].  If your monitor becomes loose or falls off after 4 days call [employer] at [phone] for suggestions on securing your monitor.      Signed, Buford Dresser, MD PhD 01/03/2020 3:47 PM    [facility]

## 2020-01-03 NOTE — Progress Notes (Signed)
Patient ID: Veronica Schwartz, female   DOB: 1966/01/18, 54 y.o.   MRN: [id number] Patient enrolled for [employer] to mail a 7 day ZIO XT long term holter monitor to the patients home.

## 2020-01-09 ENCOUNTER — Other Ambulatory Visit: Payer: Self-pay

## 2020-01-09 ENCOUNTER — Ambulatory Visit (HOSPITAL_COMMUNITY)
Admission: RE | Admit: 2020-01-09 | Discharge: 2020-01-09 | Disposition: A | Discharge status: Home / Self Care | Payer: Managed Care, Other (non HMO) | Source: Ambulatory Visit | Attending: Cardiovascular Disease | Admitting: Cardiovascular Disease

## 2020-01-09 DIAGNOSIS — I7789 Other specified disorders of arteries and arterioles: Secondary | ICD-10-CM | POA: Diagnosis not present

## 2020-01-09 DIAGNOSIS — Z8249 Family history of ischemic heart disease and other diseases of the circulatory system: Secondary | ICD-10-CM

## 2020-01-12 ENCOUNTER — Inpatient Hospital Stay: Admission: RE | Admit: 2020-01-12 | Payer: Managed Care, Other (non HMO) | Source: Ambulatory Visit

## 2020-01-16 ENCOUNTER — Inpatient Hospital Stay: Admission: RE | Admit: 2020-01-16 | Payer: Managed Care, Other (non HMO) | Source: Ambulatory Visit

## 2020-04-23 ENCOUNTER — Telehealth: Payer: Self-pay | Admitting: Endocrinology

## 2020-04-23 ENCOUNTER — Other Ambulatory Visit: Payer: Self-pay

## 2020-04-23 DIAGNOSIS — E063 Autoimmune thyroiditis: Secondary | ICD-10-CM

## 2020-04-23 MED ORDER — LEVOTHYROXINE SODIUM 150 MCG PO TABS: 150.0000 ug | ORAL_TABLET | Freq: Every day | ORAL | 0 refills | Status: DC

## 2020-04-23 NOTE — Telephone Encounter (Signed)
Per Dr. Cordelia Pen order, Rx has been sent for 90 day supply. However, name of pharmacy was not provided. Therefore, Rx was sent to last known pharmacy on file. In addition, appears pt is still scheduled for an appt with Dr. Loanne Drilling as follows:  Next Appt With Internal Medicine Renato Shin, MD) 04/26/2020 at 1:00 PM  Routing back to [place] to inquire further about this appt.  Outpatient Medication Detail   Disp Refills Start End   levothyroxine (SYNTHROID) 150 MCG tablet 90 tablet 0 04/23/2020    Sig - Route: Take 1 tablet (150 mcg total) by mouth daily before breakfast. WILL PROVIDE 90 DAY SUPPLY, THEN WILL REQUIRE APPT FOR FOLLOW UP - Oral   Sent to pharmacy as: levothyroxine (SYNTHROID) 150 MCG tablet   E-Prescribing Status: Receipt confirmed by pharmacy (04/23/2020 11:07 AM EDT)    Pharmacy  [id number] - [place], [place] - [address]  [address] [place], [place] [address]  Phone:  [phone] Fax:  [phone]

## 2020-04-23 NOTE — Telephone Encounter (Signed)
Called pt to let her know that Dr. Loanne Drilling approved rx for her. [facility]  Let pt know I was cancelling her appt now that she will have medication.  Patient was appreciative of the call back, the rx, and will be sure to call us to make a follow up appt once she has insurance again.

## 2020-04-23 NOTE — Telephone Encounter (Signed)
please refill x 3 mos 

## 2020-04-23 NOTE — Telephone Encounter (Signed)
Please review and advise.

## 2020-04-23 NOTE — Telephone Encounter (Signed)
Pt has lost her job in March, she is without insurance. She is requesting for [place] to refill the synthyroid and once she has insurance and a job she will make an appt to see [place] again. I let her know she has not been seen in one year and that is a key point to getting routine refills.  Patient saw PCP in march before she lost her job and feels that she is healthy so is asking if we would still do this for her.   Please advise

## 2020-04-26 ENCOUNTER — Ambulatory Visit: Payer: Managed Care, Other (non HMO) | Admitting: Endocrinology

## 2020-07-22 ENCOUNTER — Other Ambulatory Visit: Payer: Self-pay | Admitting: Endocrinology

## 2020-07-22 DIAGNOSIS — E038 Other specified hypothyroidism: Secondary | ICD-10-CM

## 2020-07-22 MED ORDER — LEVOTHYROXINE SODIUM 150 MCG PO TABS: 150.0000 ug | ORAL_TABLET | Freq: Every day | ORAL | 0 refills | Status: DC

## 2020-07-22 NOTE — Telephone Encounter (Signed)
Medication Refill Request  Did you call your pharmacy and request this refill first? Yes  . If patient has not contacted pharmacy first, instruct them to do so for future refills.  . Remind them that contacting the pharmacy for their refill is the quickest method to get the refill.  . Refill policy also stated that it will take anywhere between 24-72 hours to receive the refill.    Name of medication? synthroid  Is this a 90 day supply? yes  Name and location of pharmacy?  [facility]

## 2020-07-22 NOTE — Telephone Encounter (Signed)
Synthroid refilled.

## 2020-07-23 ENCOUNTER — Other Ambulatory Visit: Payer: Self-pay

## 2020-07-23 ENCOUNTER — Encounter: Payer: Self-pay | Admitting: Endocrinology

## 2020-07-23 ENCOUNTER — Ambulatory Visit (INDEPENDENT_AMBULATORY_CARE_PROVIDER_SITE_OTHER): Payer: 59 | Admitting: Endocrinology

## 2020-07-23 VITALS — BP 112/70 | HR 56 | Ht 62.0 in | Wt 145.0 lb

## 2020-07-23 DIAGNOSIS — E038 Other specified hypothyroidism: Secondary | ICD-10-CM

## 2020-07-23 DIAGNOSIS — E063 Autoimmune thyroiditis: Secondary | ICD-10-CM

## 2020-07-23 MED ORDER — LEVOTHYROXINE SODIUM 150 MCG PO TABS: 150.0000 ug | ORAL_TABLET | Freq: Every day | ORAL | 0 refills | Status: DC

## 2020-07-23 NOTE — Progress Notes (Signed)
Subjective:    Patient ID: Veronica Schwartz, female    DOB: [birth year], 54 y.o.   MRN: [id number]  HPI Pt returns for f/u of multinodular thyroid (dx'ed with hypothyroidism in 1983, and the goiter in 2018; she had right lobectomy in 2019 (pathol showed LYMPHOCYTIC THYROIDITIS WITH HURTHLE CELL CHANGE; she requires a full daily dosage of synthroid; she requests DAW).  She reports persistent weight gain.   Past Medical History:  Diagnosis Date  . Hypothyroidism     Past Surgical History:  Procedure Laterality Date  . BREAST BIOPSY Left 2017   benign  . CESAREAN SECTION     2010  . CHEST WALL BIOPSY    . THYROID LOBECTOMY Right 08/17/2018   Procedure: RIGHT THYROID LOBECTOMY;  Surgeon: Armandina Gemma, MD;  Location: [facility];  Service: General;  Laterality: Right;    Social History   Socioeconomic History  . Marital status: Divorced    Spouse name: Not on file  . Number of children: Not on file  . Years of education: Not on file  . Highest education level: Not on file  Occupational History  . Not on file  Tobacco Use  . Smoking status: Former Smoker    Types: Cigarettes    Quit date: 07/06/2016    Years since quitting: 4.0  . Smokeless tobacco: Never Used  Vaping Use  . Vaping Use: Never used  Substance and Sexual Activity  . Alcohol use: Yes    Alcohol/week: 1.0 standard drink    Types: 1 Glasses of wine per week    Comment: occasional  . Drug use: Never  . Sexual activity: Not Currently  Other Topics Concern  . Not on file  Social History Narrative  . Not on file   Social Determinants of Health   Financial Resource Strain:   . Difficulty of Paying Living Expenses: Not on file  Food Insecurity:   . Worried About Charity fundraiser in the Last Year: Not on file  . Ran Out of Food in the Last Year: Not on file  Transportation Needs:   . Lack of Transportation (Medical): Not on file  . Lack of Transportation (Non-Medical): Not on file  Physical Activity:   . Days  of Exercise per Week: Not on file  . Minutes of Exercise per Session: Not on file  Stress:   . Feeling of Stress : Not on file  Social Connections:   . Frequency of Communication with Friends and Family: Not on file  . Frequency of Social Gatherings with Friends and Family: Not on file  . Attends Religious Services: Not on file  . Active Member of Clubs or Organizations: Not on file  . Attends Archivist Meetings: Not on file  . Marital Status: Not on file  Intimate Partner Violence:   . Fear of Current or Ex-Partner: Not on file  . Emotionally Abused: Not on file  . Physically Abused: Not on file  . Sexually Abused: Not on file    Current Outpatient Medications on File Prior to Visit  Medication Sig Dispense Refill  . CALCIUM PO Take by mouth.    . Cholecalciferol (VITAMIN D) 50 MCG (2000 UT) CAPS Take 1 capsule by mouth daily.    . Cyanocobalamin (VITAMIN B-12 PO) Take 1 each by mouth daily.    Marland Kitchen MAGNESIUM PO Take 1,000 mg by mouth at bedtime.    . Omega-3 Fatty Acids (FISH OIL) 1000 MG CAPS Take 2,000 mg by  mouth daily.    . Probiotic Product (PROBIOTIC PO) Take 1 capsule by mouth daily.    . valACYclovir (VALTREX) 1000 MG tablet Take 1,000 mg by mouth daily.      No current facility-administered medications on file prior to visit.    Allergies  Allergen Reactions  . Penicillins Other (See Comments)    Fever convulsion Has patient had a PCN reaction causing immediate rash, facial/tongue/throat swelling, SOB or lightheadedness with hypotension: Unknown Has patient had a PCN reaction causing severe rash involving mucus membranes or skin necrosis: Unknown Has patient had a PCN reaction that required hospitalization: Unknown Has patient had a PCN reaction occurring within the last 10 years: No If all of the above answers are "NO", then may proceed with Cephalosporin use.     Family History  Problem Relation Age of Onset  . Breast cancer Mother 51  . Breast  cancer Maternal Grandmother 60    BP 112/70   Pulse (!) 56   Ht 5\' 2"  (1.575 m)   Wt 145 lb (65.8 kg)   LMP 07/15/2020   SpO2 98%   BMI 26.52 kg/m    Review of Systems     Objective:   Physical Exam VITAL SIGNS:  See vs page GENERAL: no distress Neck: a healed scar is present.  I do not appreciate a nodule in the thyroid or elsewhere in the neck.    Lab Results  Component Value Date   TSH 21.08 (H) 07/23/2020      Assessment & Plan:  Hypothyroidism: uncontrolled I have sent a prescription to your pharmacy, to increase synthroid. Recheck TFT 1 month.

## 2020-07-23 NOTE — Patient Instructions (Addendum)
Blood tests are requested for you today.  We'll let you know about the results.  Please come back for a follow-up appointment in 1 year.    

## 2020-07-24 LAB — T4, FREE: Free T4: 1 ng/dL (ref 0.60–1.60)

## 2020-07-24 LAB — TSH: TSH: 21.08 u[IU]/mL — ABNORMAL HIGH (ref 0.35–4.50)

## 2020-07-24 MED ORDER — SYNTHROID 175 MCG PO TABS: 175.0000 ug | ORAL_TABLET | Freq: Every day | ORAL | 5 refills | Status: DC

## 2020-07-25 ENCOUNTER — Encounter: Payer: Self-pay | Admitting: Endocrinology

## 2020-07-25 ENCOUNTER — Telehealth: Payer: Self-pay | Admitting: Endocrinology

## 2020-07-25 NOTE — Telephone Encounter (Signed)
I do not see where we called in Levothyroxine - but patient is unable to pick up her Rx for the synthroid because her insurance says we have already called in Levothyroxine to the mail order pharmacy? Patient called asking if we could please cancel the prescription for the Levothyroxine sent electrically to [facility] so she can pick up her synthroid at [facility]. Ph# [phone]

## 2020-07-25 NOTE — Telephone Encounter (Signed)
Patient also states she had labs done and her results came back and she said her Thyroid is way too high, she also would like to speak to someone regarding that.

## 2020-07-26 ENCOUNTER — Other Ambulatory Visit: Payer: Self-pay | Admitting: Endocrinology

## 2020-07-27 NOTE — Telephone Encounter (Signed)
Prescription error fixed.

## 2020-10-15 ENCOUNTER — Telehealth: Payer: Self-pay | Admitting: Endocrinology

## 2020-10-15 ENCOUNTER — Other Ambulatory Visit: Payer: Self-pay | Admitting: *Deleted

## 2020-10-15 MED ORDER — SYNTHROID 175 MCG PO TABS: ORAL_TABLET | ORAL | 1 refills | Status: DC

## 2020-10-15 NOTE — Telephone Encounter (Signed)
Rx sent for Synthroid DAW

## 2020-10-15 NOTE — Telephone Encounter (Signed)
Patient called re: Patient requests a new RX for Brand name Synthroid (Patient states she feels different taking Levothyroxine and that it is not working-Patient received Levothyroxine instead of Synthroid) be sent to [facility] ID- [id number]

## 2020-10-17 ENCOUNTER — Other Ambulatory Visit: Payer: Self-pay | Admitting: *Deleted

## 2020-10-17 MED ORDER — SYNTHROID 175 MCG PO TABS: ORAL_TABLET | ORAL | 1 refills | Status: DC

## 2020-10-17 NOTE — Telephone Encounter (Signed)
That's what was done the first time, but I've resent the rx with it in all caps!

## 2020-10-17 NOTE — Telephone Encounter (Signed)
"  Dispense as written only for brand name Synthroid" - this is what we have to change the Rx to in order for pharmacy to fill correctly.

## 2020-12-24 ENCOUNTER — Other Ambulatory Visit: Payer: Self-pay

## 2020-12-25 ENCOUNTER — Ambulatory Visit (INDEPENDENT_AMBULATORY_CARE_PROVIDER_SITE_OTHER): Payer: 59 | Admitting: Endocrinology

## 2020-12-25 VITALS — BP 118/76 | HR 78 | Ht 62.0 in | Wt 146.6 lb

## 2020-12-25 DIAGNOSIS — E063 Autoimmune thyroiditis: Secondary | ICD-10-CM

## 2020-12-25 DIAGNOSIS — E038 Other specified hypothyroidism: Secondary | ICD-10-CM

## 2020-12-25 LAB — TSH: TSH: 0.8 u[IU]/mL (ref 0.35–4.50)

## 2020-12-25 LAB — T4, FREE: Free T4: 1.48 ng/dL (ref 0.60–1.60)

## 2020-12-25 NOTE — Progress Notes (Signed)
Subjective:    Patient ID: Veronica Schwartz, female    DOB: [birth year], 55 y.o.   MRN: [id number]  HPI Pt returns for f/u of multinodular thyroid (dx'ed with hypothyroidism in 1983, and the goiter in 2018; she had right lobectomy in 2019 (pathol showed LYMPHOCYTIC THYROIDITIS WITH HURTHLE CELL CHANGE; she requires a full daily dosage of synthroid; she requests DAW).  She reports weight gain and fatigue.  She takes synthroid as rx'ed.   Past Medical History:  Diagnosis Date  . Hypothyroidism     Past Surgical History:  Procedure Laterality Date  . BREAST BIOPSY Left 2017   benign  . CESAREAN SECTION     2010  . CHEST WALL BIOPSY    . THYROID LOBECTOMY Right 08/17/2018   Procedure: RIGHT THYROID LOBECTOMY;  Surgeon: Armandina Gemma, MD;  Location: [facility];  Service: General;  Laterality: Right;    Social History   Socioeconomic History  . Marital status: Divorced    Spouse name: Not on file  . Number of children: Not on file  . Years of education: Not on file  . Highest education level: Not on file  Occupational History  . Not on file  Tobacco Use  . Smoking status: Former Smoker    Types: Cigarettes    Quit date: 07/06/2016    Years since quitting: 4.4  . Smokeless tobacco: Never Used  Vaping Use  . Vaping Use: Never used  Substance and Sexual Activity  . Alcohol use: Yes    Alcohol/week: 1.0 standard drink    Types: 1 Glasses of wine per week    Comment: occasional  . Drug use: Never  . Sexual activity: Not Currently  Other Topics Concern  . Not on file  Social History Narrative  . Not on file   Social Determinants of Health   Financial Resource Strain: Not on file  Food Insecurity: Not on file  Transportation Needs: Not on file  Physical Activity: Not on file  Stress: Not on file  Social Connections: Not on file  Intimate Partner Violence: Not on file    Current Outpatient Medications on File Prior to Visit  Medication Sig Dispense Refill  . CALCIUM PO Take  by mouth.    . Cholecalciferol (VITAMIN D) 50 MCG (2000 UT) CAPS Take 1 capsule by mouth daily.    . Cyanocobalamin (VITAMIN B-12 PO) Take 1 each by mouth daily.    Marland Kitchen MAGNESIUM PO Take 1,000 mg by mouth at bedtime.    . Omega-3 Fatty Acids (FISH OIL) 1000 MG CAPS Take 2,000 mg by mouth daily.    . Probiotic Product (PROBIOTIC PO) Take 1 capsule by mouth daily.    Marland Kitchen SYNTHROID 175 MCG tablet Take 1 tablet daily DISPENSE AS WRITTEN FOR NAME BRAND SYNTHROID ONLY 90 tablet 1  . valACYclovir (VALTREX) 1000 MG tablet Take 1,000 mg by mouth daily.      No current facility-administered medications on file prior to visit.     Family History  Problem Relation Age of Onset  . Breast cancer Mother 58  . Breast cancer Maternal Grandmother 60    BP 118/76 (BP Location: Right Arm, Patient Position: Sitting, Cuff Size: Normal)   Pulse 78   Ht 5\' 2"  (1.575 m)   Wt 146 lb 9.6 oz (66.5 kg)   SpO2 98%   BMI 26.81 kg/m    Review of Systems     Objective:   Physical Exam VITAL SIGNS:  See vs page  GENERAL: no distress Neck: a healed scar is present.  I do not appreciate a nodule in the thyroid or elsewhere in the neck.     Lab Results  Component Value Date   TSH 0.80 12/25/2020      Assessment & Plan:  Hypothyroidism: well-replaced.  Please continue the same synthroid.   Patient Instructions  Blood tests are requested for you today.  We'll let you know about the results.   You may get a better price at [email or web address]   It is best to never miss the medication.  However, if you do miss it, next best is to double up the next time.   Please come back for a follow-up appointment in 1 year.

## 2020-12-25 NOTE — Patient Instructions (Addendum)
Blood tests are requested for you today.  We'll let you know about the results.   You may get a better price at [email or web address]   It is best to never miss the medication.  However, if you do miss it, next best is to double up the next time.   Please come back for a follow-up appointment in 1 year.

## 2021-03-27 ENCOUNTER — Encounter: Payer: Self-pay | Admitting: Nurse Practitioner

## 2021-03-27 ENCOUNTER — Ambulatory Visit (INDEPENDENT_AMBULATORY_CARE_PROVIDER_SITE_OTHER): Payer: 59 | Admitting: Nurse Practitioner

## 2021-03-27 ENCOUNTER — Other Ambulatory Visit (INDEPENDENT_AMBULATORY_CARE_PROVIDER_SITE_OTHER): Payer: 59

## 2021-03-27 VITALS — BP 112/68 | HR 57 | Ht 62.0 in | Wt 147.0 lb

## 2021-03-27 DIAGNOSIS — F1090 Alcohol use, unspecified, uncomplicated: Secondary | ICD-10-CM

## 2021-03-27 DIAGNOSIS — R1011 Right upper quadrant pain: Secondary | ICD-10-CM

## 2021-03-27 DIAGNOSIS — R635 Abnormal weight gain: Secondary | ICD-10-CM

## 2021-03-27 DIAGNOSIS — R140 Abdominal distension (gaseous): Secondary | ICD-10-CM

## 2021-03-27 DIAGNOSIS — Z7289 Other problems related to lifestyle: Secondary | ICD-10-CM

## 2021-03-27 DIAGNOSIS — D7589 Other specified diseases of blood and blood-forming organs: Secondary | ICD-10-CM

## 2021-03-27 DIAGNOSIS — Z789 Other specified health status: Secondary | ICD-10-CM

## 2021-03-27 LAB — COMPREHENSIVE METABOLIC PANEL
ALT: 10 U/L (ref 0–35)
AST: 17 U/L (ref 0–37)
Albumin: 4.3 g/dL (ref 3.5–5.2)
Alkaline Phosphatase: 28 U/L — ABNORMAL LOW (ref 39–117)
BUN: 20 mg/dL (ref 6–23)
CO2: 27 mEq/L (ref 19–32)
Calcium: 9.2 mg/dL (ref 8.4–10.5)
Chloride: 101 mEq/L (ref 96–112)
Creatinine, Ser: 0.68 mg/dL (ref 0.40–1.20)
GFR: 98.49 mL/min (ref >60.00)
Glucose, Bld: 87 mg/dL (ref 70–99)
Potassium: 4.2 mEq/L (ref 3.5–5.1)
Sodium: 137 mEq/L (ref 135–145)
Total Bilirubin: 0.7 mg/dL (ref 0.2–1.2)
Total Protein: 6.7 g/dL (ref 6.0–8.3)

## 2021-03-27 LAB — CBC WITH DIFFERENTIAL/PLATELET
Basophils Absolute: 0.1 10*3/uL (ref 0.0–0.1)
Basophils Relative: 0.9 % (ref 0.0–3.0)
Eosinophils Absolute: 0.1 10*3/uL (ref 0.0–0.7)
Eosinophils Relative: 1.7 % (ref 0.0–5.0)
HCT: 39.1 % (ref 36.0–46.0)
Hemoglobin: 13.5 g/dL (ref 12.0–15.0)
Lymphocytes Relative: 31.2 % (ref 12.0–46.0)
Lymphs Abs: 2 10*3/uL (ref 0.7–4.0)
MCHC: 34.6 g/dL (ref 30.0–36.0)
MCV: 106 fl — ABNORMAL HIGH (ref 78.0–100.0)
Monocytes Absolute: 0.8 10*3/uL (ref 0.1–1.0)
Monocytes Relative: 12.1 % — ABNORMAL HIGH (ref 3.0–12.0)
Neutro Abs: 3.5 10*3/uL (ref 1.4–7.7)
Neutrophils Relative %: 54.1 % (ref 43.0–77.0)
Platelets: 263 10*3/uL (ref 150.0–400.0)
RBC: 3.69 Mil/uL — ABNORMAL LOW (ref 3.87–5.11)
RDW: 12.7 % (ref 11.5–15.5)
WBC: 6.4 10*3/uL (ref 4.0–10.5)

## 2021-03-27 LAB — B12 AND FOLATE PANEL
Folate: 8.4 ng/mL (ref >5.9)
Vitamin B-12: 745 pg/mL (ref 211–911)

## 2021-03-27 LAB — GAMMA GT: GGT: 12 U/L (ref 7–51)

## 2021-03-27 MED ORDER — RIFAXIMIN 550 MG PO TABS: ORAL_TABLET | ORAL | 0 refills | Status: DC

## 2021-03-27 MED ORDER — LINACLOTIDE 72 MCG PO CAPS: 72.0000 ug | ORAL_CAPSULE | Freq: Every day | ORAL | 0 refills | Status: DC

## 2021-03-27 NOTE — Patient Instructions (Addendum)
LABS:  Lab work has been ordered for you today. Our lab is located in the basement. Press "B" on the elevator. The lab is located at the first door on the left as you exit the elevator.  HEALTHCARE LAWS AND MY CHART RESULTS: Due to recent changes in healthcare laws, you may see the results of your imaging and laboratory studies on MyChart before your provider has had a chance to review them.   We understand that in some cases there may be results that are confusing or concerning to you. Not all laboratory results come back in the same time frame and the provider may be waiting for multiple results in order to interpret others.  Please give [place] 48 hours in order for your provider to thoroughly review all the results before contacting the office for clarification of your results.   IMAGING: You will be contacted by [facility] (Your caller ID will indicate phone # [phone]) in the next 2 days to schedule your Complete Abdominal Ultrasound. If you have not heard from them within 2 business days, please call [facility] at [phone] to follow up on the status of your appointment.    MEDICATION: We have sent the following medication to your pharmacy for you to pick up at your convenience: Linzess 72 mcg, take 1 a day 30 minutes before breakfast to increase stool output. Xifaxan 550 mg tablet, take 1 tablet three times a day for 14 days.  OVER THE COUNTER MEDICATION Please purchase the following medications over the counter and take as directed:  Florastor Probiotic, take 1 twice a day for 4-6 weeks.  Reduce alcohol use.  We have scheduled you a follow up with Dr. Tarri Glenn on 05/08/21 at 9:50am.  It was great seeing you today! Thank you for entrusting me with your care and choosing [facility].  Noralyn Pick, CRNP  The [facility] providers would like to encourage you to use [facility] to communicate with providers for non-urgent  requests or questions.  Due to long hold times on the telephone, sending your provider a message by [facility] may be faster and more efficient way to get a response. Please allow 48 business hours for a response.  Please remember that this is for non-urgent requests/questions. If you are age 36 or older, your body mass index should be between 23-30. Your Body mass index is 26.89 kg/m. If this is out of the aforementioned range listed, please consider follow up with your Primary Care Provider.  If you are age 47 or younger, your body mass index should be between 19-25. Your Body mass index is 26.89 kg/m. If this is out of the aformentioned range listed, please consider follow up with your Primary Care Provider.

## 2021-03-27 NOTE — Progress Notes (Addendum)
03/27/2021 Veronica Schwartz [id number] 1966/03/14   Chief Complaint: Abdominal bloat, difficulty loosing weight   History of Present Illness: Veronica Schwartz is a 55 year old female with a past medical history of hypothyroidism and lymphocytic thyroiditis s/p right thyroid lobectomy 2019. She was last seen in office by Dr. Tarri Glenn 12/27/2019 with complaints of having abdominal bloat and difficulty losing weight. A SIBO breath test was ordered to rule out small bacterial overgrowth but she did not complete as the kit instructions were rather confusing which is understandable. Her prior EGD and colonoscopy reports done 4 years ago in [place] were requested but apparently not received. Negative celiac serology. She lost her insurance therefore she did not follow up in our office until today as she now has new health insurance.   She complains of upper abdominal bloat which is fairly constant. 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 She has gained 7lbs over the past year. She saw her endocrinologist Dr. Loanne Drilling for hypothyroidism follow up 12/25/2020 and she discussed her concern regarding her inability to lose weight. Her thyroid status was stable at that time with TSH level of 0.80 on Synthroid with plans to follow up in 1 year. She has constant abdominal bloat. She passes a soft small to normal sized brown BM once or twice daily but doesn't always feel emptied. She sometimes feels the urge to pass a BM but nothing happens. Sometimes stools are "heavy". She started taking Inno bowel cleanse one week ago which results in passing loose stools. No rectal bleeding or black stools. She has a decreased appetite. She stated her colonoscopy done in [place] showed a poor prep but her colon was washed out enough for her GI to evaluate the colon and to remove a few polyps. No known family history of colon cancer. She reports  having 2 episodes or RUQ pain which occurred when bending down and lasted for 5 to 10 minutes, went away after she massaged this area. No N/V.   She is mostly concerned regarding her abdominal bloat and inability to lose weight. She has taken various probiotics in the past ie: Culturelle without improvement. She was seen recently by her gynecologist and she reported a pelvic sonogram showed normal ovaries and she had an abnormal pap smear with a negative colposcopy.   Labs 12/25/2019 showed a low Alk phos 31. Normal AST/ALT levels 24/14 and T. Bili 0.6. Labs 12/06/2019 showed a Hg level of 13.2 with a moderately elevated MCV 106.9. B12 level of 364. Folate > 23.7.   She drinks 1/2 bottle (2 to 3 glasses) up to 2/3 bottle to 1 full bottle of wine daily. I discussed her alcohol intake was consistent with alcohol use disorder and likely contributing to her inability to lose weight and elevated MCV level.    Current Outpatient Medications on File Prior to Visit  Medication Sig Dispense Refill   CALCIUM PO Take by mouth.     Cholecalciferol (VITAMIN D) 50 MCG (2000 UT) CAPS Take 1 capsule by mouth daily.     Cyanocobalamin (VITAMIN B-12 PO) Take 1 each by mouth daily.     MAGNESIUM PO Take 1,000 mg by mouth at bedtime.     Omega-3 Fatty Acids (FISH OIL) 1000 MG CAPS Take 2,000 mg by mouth daily.     Probiotic Product (PROBIOTIC PO) Take 1 capsule by  mouth daily.     SYNTHROID 175 MCG tablet Take 1 tablet daily DISPENSE AS WRITTEN FOR NAME BRAND SYNTHROID ONLY 90 tablet 1   valACYclovir (VALTREX) 1000 MG tablet Take 1,000 mg by mouth daily.      No current facility-administered medications on file prior to visit.    Current Medications, Allergies, Past Medical History, Past Surgical History, Family History and Social History were reviewed in [employer] record.   Review of Systems:   Constitutional: Negative for fever, sweats, chills or weight loss.  Respiratory: Negative  for shortness of breath.   Cardiovascular: Negative for chest pain, palpitations and leg swelling.  Gastrointestinal: See HPI.  Musculoskeletal: Negative for back pain or muscle aches.  Neurological: Negative for dizziness, headaches or paresthesias.    Physical Exam BP 112/68   Pulse (!) 57   Ht '5\' 2"'  (1.575 m)   Wt 147 lb (66.7 kg)   BMI 26.89 kg/m   General: 55 year old female in no acute distress. Head: Normocephalic and atraumatic. Eyes: No scleral icterus. Conjunctiva pink . Ears: Normal auditory acuity. Mouth: Dentition intact. No ulcers or lesions.  Neck: Thyroid surgical scar intact.  Lungs: Clear throughout to auscultation. Heart: Regular rate and rhythm, no murmur. Abdomen: Soft, nondistended. RUQ tenderness without rebound or guarding. Liver border palpated with deep inspiration. No masses or hepatomegaly. Normal bowel sounds x 4 quadrants.  Rectal: Deferred.  Musculoskeletal: Symmetrical with no gross deformities. Extremities: No edema. Neurological: Alert oriented x 4. No focal deficits.  Psychological: Alert and cooperative. Normal mood and affect  Assessment and Recommendations: 55 year old female with abdominal bloat, intermittent incomplete bowel evacuation  -Request copy of colonoscopy and biopsy report from [place] [facility] 4 years ago -Trial with Linzess 72 mcg one tab po QD to be taken 30 minutes before breakfast to increase stool output -If abdominal bloat continues in 1 to 2 weeks then start Xifaxan 592m one po tid x 14 days for possible SIBO -Florastor probiotic one po bid x 4 to 6 weeks   2. RUQ abdominal pain x 2 episodes  -Abdominal sonogram  -CBC, CMP, GGT  3. Alcohol use disorder -Reduce alcohol intake recommended  4. History of colon polyps  -Request copy of colonoscopy done in [place] 4 years ago, to verify appropriate colonoscopy recall date after records reviewed   5. History of thyroid disease with inability to lose weight despite eating a  healthy diet and routine exercise  -Continue follow up with endocrinologist Dr. ELoanne Drilling-Discussed she would likely lose weight with reducing alcohol intake  -Continue eating a healthy diet and exercise as tolerated   6. Elevated MCV without anemia. B12 level low normal. -Repeat Vitamin B12 level and folate   Patient to call our office if symptoms worsen  Follow up in office in 6 to 8 weeks   Addendum: [facility]: Sessile polyp 0.6 cm removed from the descending colon.  Sessile polyp 0.6 cm removed from the ascending colon.  Diverticulosis of the large intestine without perforation, abscess or bleeding.  The bowel prep using Suprep was fair.  The colon was mildly tortuous.  Biopsies of the terminal ileum were negative.  Random colon biopsies showed colonic mucosa with lymphoid aggregates, negative for colitis, dysplasia or malignancy.  The transverse polyp was tubular adenomatous.  The descending colon polyp was a hyperplastic polyp.  An EGD was done on the same date which showed acute gastritis without bleeding,  diaphragmatic hernia without obstruction, esophagitis, esophageal erosion and several small sessile polyp size of 1 cm in the duodenal bulb..  Duodenal biopsies showed duodenal mucosa with Brunner gland hyperplasia and gastric mucin cell metaplasia consistent with peptic duodenitis.  The duodenal mucosa shows a relatively normal villous architecture without increased intraepithelial lymphocytosis.  No evidence of celiac disease.  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

## 2021-03-28 ENCOUNTER — Telehealth: Payer: Self-pay

## 2021-03-28 NOTE — Telephone Encounter (Signed)
Veronica Schwartz (Key: [id number]) Rx #: [phone] Xifaxan 550MG  tablets   Form [facility] Electronic Prior Authorization Form [phone] NCP[place])  Awaiting determination.

## 2021-03-28 NOTE — Telephone Encounter (Signed)
Request Reference Number: [id number]. XIFAXAN TAB 550MG  is approved through 06/28/2021. Your patient may now fill this prescription and it will be covered.

## 2021-03-28 NOTE — Telephone Encounter (Signed)
Prior Authorization for Linzess submitted via CoverMyMeds.   Awaiting response from insurance.

## 2021-03-31 ENCOUNTER — Other Ambulatory Visit: Payer: Self-pay

## 2021-03-31 DIAGNOSIS — R748 Abnormal levels of other serum enzymes: Secondary | ICD-10-CM

## 2021-03-31 NOTE — Progress Notes (Signed)
Reviewed and agree with management plans. ? ?Lauriana Denes L. Odena Mcquaid, MD, MPH  ?

## 2021-03-31 NOTE — Telephone Encounter (Signed)
PA approved.

## 2021-04-10 ENCOUNTER — Other Ambulatory Visit: Payer: Self-pay

## 2021-04-10 ENCOUNTER — Ambulatory Visit (HOSPITAL_COMMUNITY)
Admission: RE | Admit: 2021-04-10 | Discharge: 2021-04-10 | Disposition: A | Discharge status: Home / Self Care | Payer: 59 | Source: Ambulatory Visit | Attending: Nurse Practitioner | Admitting: Nurse Practitioner

## 2021-04-10 DIAGNOSIS — D7589 Other specified diseases of blood and blood-forming organs: Secondary | ICD-10-CM

## 2021-04-10 DIAGNOSIS — F1090 Alcohol use, unspecified, uncomplicated: Secondary | ICD-10-CM

## 2021-04-10 DIAGNOSIS — R635 Abnormal weight gain: Secondary | ICD-10-CM

## 2021-04-10 DIAGNOSIS — Z789 Other specified health status: Secondary | ICD-10-CM

## 2021-04-10 DIAGNOSIS — R140 Abdominal distension (gaseous): Secondary | ICD-10-CM | POA: Diagnosis present

## 2021-04-10 DIAGNOSIS — R1011 Right upper quadrant pain: Secondary | ICD-10-CM

## 2021-04-10 DIAGNOSIS — Z7289 Other problems related to lifestyle: Secondary | ICD-10-CM | POA: Insufficient documentation

## 2021-04-18 ENCOUNTER — Other Ambulatory Visit: Payer: Self-pay | Admitting: Nurse Practitioner

## 2021-04-18 MED ORDER — LINACLOTIDE 145 MCG PO CAPS: 145.0000 ug | ORAL_CAPSULE | Freq: Every day | ORAL | 1 refills | Status: DC

## 2021-04-19 ENCOUNTER — Other Ambulatory Visit: Payer: Self-pay | Admitting: Endocrinology

## 2021-04-22 ENCOUNTER — Other Ambulatory Visit: Payer: Self-pay

## 2021-04-22 ENCOUNTER — Telehealth: Payer: Self-pay | Admitting: Nurse Practitioner

## 2021-04-22 DIAGNOSIS — R1011 Right upper quadrant pain: Secondary | ICD-10-CM

## 2021-04-22 DIAGNOSIS — R140 Abdominal distension (gaseous): Secondary | ICD-10-CM

## 2021-04-22 DIAGNOSIS — R194 Change in bowel habit: Secondary | ICD-10-CM

## 2021-04-22 NOTE — Telephone Encounter (Signed)
Spoke with the patient. Records obtained via email. CT 04/28/21 at 3:45 pm. She will pick up her instructions and contrast from this office.

## 2021-04-22 NOTE — Telephone Encounter (Signed)
Beth, refer to [place]. Patient continues to have upper abd pain and generalized bloat. Change in bowel pattern. Pls schedule her for an abd/pelvic CT with oral contrast only. Pls have patient obtain her past colonoscopy records from [place]. Thx

## 2021-04-28 ENCOUNTER — Other Ambulatory Visit: Payer: Self-pay

## 2021-04-28 ENCOUNTER — Ambulatory Visit (INDEPENDENT_AMBULATORY_CARE_PROVIDER_SITE_OTHER)
Admission: RE | Admit: 2021-04-28 | Discharge: 2021-04-28 | Disposition: A | Discharge status: Home / Self Care | Payer: 59 | Source: Ambulatory Visit | Attending: Nurse Practitioner | Admitting: Nurse Practitioner

## 2021-04-28 DIAGNOSIS — R194 Change in bowel habit: Secondary | ICD-10-CM

## 2021-04-28 DIAGNOSIS — R1011 Right upper quadrant pain: Secondary | ICD-10-CM | POA: Diagnosis not present

## 2021-04-28 DIAGNOSIS — R140 Abdominal distension (gaseous): Secondary | ICD-10-CM

## 2021-04-28 MED ORDER — IOHEXOL 300 MG/ML  SOLN
100.0000 mL | Freq: Once | INTRAMUSCULAR | Status: AC | PRN
  Administered 2021-04-28: 100 mL via INTRAVENOUS

## 2021-05-08 ENCOUNTER — Other Ambulatory Visit (INDEPENDENT_AMBULATORY_CARE_PROVIDER_SITE_OTHER): Payer: 59

## 2021-05-08 ENCOUNTER — Ambulatory Visit (INDEPENDENT_AMBULATORY_CARE_PROVIDER_SITE_OTHER): Payer: 59 | Admitting: Gastroenterology

## 2021-05-08 ENCOUNTER — Encounter: Payer: Self-pay | Admitting: Gastroenterology

## 2021-05-08 VITALS — BP 94/68 | HR 74 | Ht 62.0 in | Wt 142.0 lb

## 2021-05-08 DIAGNOSIS — R140 Abdominal distension (gaseous): Secondary | ICD-10-CM

## 2021-05-08 DIAGNOSIS — R1011 Right upper quadrant pain: Secondary | ICD-10-CM | POA: Diagnosis not present

## 2021-05-08 DIAGNOSIS — R194 Change in bowel habit: Secondary | ICD-10-CM

## 2021-05-08 LAB — MAGNESIUM: Magnesium: 2.2 mg/dL (ref 1.5–2.5)

## 2021-05-08 MED ORDER — DICYCLOMINE HCL 20 MG PO TABS: 20.0000 mg | ORAL_TABLET | Freq: Four times a day (QID) | ORAL | 2 refills | Status: DC

## 2021-05-08 MED ORDER — LINACLOTIDE 145 MCG PO CAPS: 145.0000 ug | ORAL_CAPSULE | Freq: Every day | ORAL | 1 refills | Status: DC

## 2021-05-08 NOTE — Patient Instructions (Addendum)
It was my pleasure to provide care to you today. Based on our discussion, I am providing you with my recommendations below:  RECOMMENDATION(S):   Trial of gluten-free diet for at least 2 weeks to see if this improves your symptoms Avoid artificial sweeteners Consider low FODMAP diet in the future. Literature provided to you today Continue your probiotics and Linzess  PRESCRIPTION MEDICATION(S):   We have sent the following medication(s) to your pharmacy:  Dicyclomine - please take 20mg  by mouth 4 times daily  NOTE: If your medication(s) requires a PRIOR AUTHORIZATION, we will receive notification from your pharmacy. Once received, the process to submit for approval may take up to 7-10 business days. You will be contacted about any denials we have received from your insurance company as well as alternatives recommended by your provider.  LABS:   Please proceed to the basement level for lab work before leaving today. Press "B" on the elevator. The lab is located at the first door on the left as you exit the elevator.  HEALTHCARE LAWS AND MY CHART RESULTS:   Due to recent changes in healthcare laws, you may see results of your imaging and/or laboratory studies on MyChart before I have had a chance to review them.  I understand that in some cases there may be results that are confusing or concerning to you. Please understand that not all results are received at same time and often I may need to interpret multiple results in order to provide you with the best plan of care or course of treatment. Therefore, I ask that you please give me 48 hours to thoroughly review all your results before contacting my office for clarification.   FOLLOW UP:  After your procedure, you will receive a call from my office staff regarding my recommendation for follow up.  BMI:  If you are age 54 or younger, your body mass index should be between 19-25. Your Body mass index is 25.97 kg/m. If this is out of the  aformentioned range listed, please consider follow up with your Primary Care Provider.   MY CHART:  The [facility] providers would like to encourage you to use [facility] to communicate with providers for non-urgent requests or questions.  Due to long hold times on the telephone, sending your provider a message by [facility] may be a faster and more efficient way to get a response.  Please allow 48 business hours for a response.  Please remember that this is for non-urgent requests.   Thank you for trusting me with your gastrointestinal care!    Thornton Park, MD, MPH

## 2021-05-08 NOTE — Progress Notes (Signed)
Referring Provider: Marrian Salvage,* Primary Care Physician:  Marrian Salvage, [place]  Chief complaint:  Bloating, inability to lose weight   IMPRESSION:  Persistent abdominal bloating - evaluation and prior treatment trials outlined below Abnormal small bowel on CT 04/30/21     - ? Incidental finding    - dilated loop in the small bowel with air-fluid levels without a lead point History of colon polyps with poor bowel prep on colonoscopy in [place] Low alkaline phosphatase    - unlikely to be Wilson's disease at age >39    - may be related to deficiency in zinc, hypomagnesemia, her underlying thyroid disease    - unlikely to be related to bloating  Chronic bloating and distension without alarm features. She has already had some evaluation as noted below and failed an empiric treatment trial of Xifaxan for possible small intestinal bacterial overgrowth. The differential continues to include carbohydrate intolerance including lactose and fructose, pancreatic insufficiency, GI dysmotility including gastroparesis, and body mechanics. Disorders of the brain-gut interaction also present with bloating including IBS, chronic constipation, functional dyspepsia, pelvic floor dysfunction and functional bloating.  Unfortunately, symptoms do not help [place] delineate the cause.   Abnormal CT scan: May be incidental findings. Consider CT enteroscopy for further evaluation.  Given this differential, I recommended the following:  PLAN: - Stool for fecal elastase to screen for EPI - Magensium and zinc levels to evaluate her low alk phos - Colonoscopy with a 2 day bowel prep - Continue to work to obtain records from [place] - Avoid artificial sweeteners - Trial of gluten-free diet for at least 2 weeks to see if this makes her feel better - Consider low FODMAP diet in the future (provided brochure today) if no improvement to gluten free - Continue daily probiotics - Continue Linzess  - Trial of  antispasmodic with dicyclomine 67m QID  - Consider neuromodulators if no improvement with the above plan including amitriptyline 50 mg daily, escitalopram 10 mg daily or buspirone 10 mg TID.   Please see the "Patient Instructions" section for addition details about the plan.  HPI: Veronica Macdonellis a 55y.o. female who returns in follow-up for further evaluation of abdominal pain, bloating, and intentional weight gain.  I initially saw her in consultation 12/27/2019.  She lost insurance.  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  Thyroid status has been stable.  She reports constant upper abdominal bloat and inability to lose weight.  No appetite with early satiety. Finds bloating worsens over the course of a day with development of distension by the end of the day. Symptoms improve with defecation.  No relationship to eating or movement.  Denies constipation although she feels slightly better using Linzess daily. On this regimen, she passes a small to normal sized bowel movement once or twice daily.  There is a sense of incomplete evacuation.  She occasionally has urgency but does not pass a bowel movement. No association of symptoms with stress.   She thought it might be a hernia, but, has been surprised by the reassuring ultrasound and CT.  Vegetarian. No fried foods. No identified food triggers. No change with dairy or exposure to gluten. Sugar alcohols worsen her symptoms.  Works out daily. Taking a daily probiotic. Drinks KSecretary/administrator  regularly.   Prior evaluation: - Negative celiac serologies - Normal stool studies in CT - Colonoscopy in CT 03/27/2016 (results reviewed today) 76m tubular adenoma, fair/poor prep - Normal abdominal ultrasound 04/10/2021 - CT abd/pelvis  with contrast 04/30/21: small liver cysts, borderline dilated loop in the small bowel with air-fluid levels without a lead point, scattered colonic diverticula - SIBO breath test ordered but not completed due to complexity of instructions - Normal evaluation by GYN including pelvic ultrasound - Normal liver enzymes with a low alk phos at 28 - Normal CBC with moderately elevated MCV at 106.9  Treatment trials include: - Trial of Xifaxan in [place] in 2017 without change in symptoms - Multiple probiotics including Culturelle - Inno bowel cleanse resulted in loose stools - Linzess 72 mcg daily may provide some relief, but, it's been incomplete - FDGuard - doesn't remember taking this or having any relief  Family history of PUD. No known family history of colon cancer or polyps. No family history of uterine/endometrial cancer, pancreatic cancer or gastric/stomach cancer.  Endoscopic history: - Colonoscopy 03/27/2016 in CT: 642mtubular adenoma, fair/poor prep  Past Medical History:  Diagnosis Date   Hypothyroidism     Past Surgical History:  Procedure Laterality Date   BREAST BIOPSY Left 2017   benign   CESAREAN SECTION     2010   CHEST WALL BIOPSY     THYROID LOBECTOMY Right 08/17/2018   Procedure: RIGHT THYROID LOBECTOMY;  Surgeon: GeArmandina GemmaMD;  Location: [facility];  Service: General;  Laterality: Right;    Current Outpatient Medications  Medication Sig Dispense Refill   Cholecalciferol (VITAMIN D) 50 MCG (2000 UT) CAPS Take 1 capsule by mouth daily.     Cyanocobalamin (VITAMIN B-12 PO) Take 1 each by mouth daily.     dicyclomine (BENTYL) 20 MG tablet Take 1 tablet (20 mg total) by mouth every 6 (six) hours. 120 tablet 2   MAGNESIUM PO Take 1,000 mg by mouth at bedtime.     Omega-3 Fatty Acids (FISH OIL) 1000 MG CAPS Take 2,000 mg by mouth daily.     Probiotic Product (PROBIOTIC PO) Take 1 capsule by mouth daily.     SYNTHROID 175 MCG tablet TAKE 1 TABLET DAILY DISPENSE AS WRITTEN  FOR NAME BRAND SYNTHROID ONLY 90 tablet 1   valACYclovir (VALTREX) 1000 MG tablet Take 1,000 mg by mouth daily.      linaclotide (LINZESS) 145 MCG CAPS capsule Take 1 capsule (145 mcg total) by mouth daily before breakfast. 30 capsule 1   No current facility-administered medications for this visit.    Allergies as of 05/08/2021 - Review Complete 05/08/2021  Allergen Reaction Noted   Penicillins Other (See Comments) 05/17/2018    Family History  Problem Relation Age of Onset   Breast cancer Mother 6063 Breast cancer Maternal Grandmother 6065 Stomach cancer Paternal Aunt    Colon cancer Maternal Aunt    Esophageal cancer Neg Hx    Pancreatic cancer Neg Hx      Physical Exam: General:   Alert,  well-nourished, pleasant and cooperative in NAD Weight 142 lb Weight 147 lb 03/27/21 Weight 143 lb 12/27/19 Weight 140 lb 12/06/19 Head:  Normocephalic and atraumatic. Eyes:  Sclera clear, no icterus.   Conjunctiva pink. Abdomen:  Soft, nontender, nondistended, normal bowel sounds, no rebound or guarding. No hepatosplenomegaly.   LAD: No inguinal or umbilical LAD Extremities:  No clubbing or edema. Neurologic:  Alert and  oriented x4;  grossly nonfocal Skin:  Intact without significant lesions or rashes. Psych:  Alert and cooperative. Normal mood and affect.     Terryn Redner L. Tarri Glenn, MD, MPH 05/11/2021, 1:03 PM

## 2021-05-11 ENCOUNTER — Encounter: Payer: Self-pay | Admitting: Gastroenterology

## 2021-05-13 LAB — EXTRA SPECIMEN

## 2021-05-13 LAB — ZINC

## 2021-05-14 NOTE — Progress Notes (Signed)
Called [facility] to inquire further about why the test had been cancelled. States when the plasma was received from [facility], it was received in a container that WAS NOT metal free. Advised pt will need to go to a [facility] location to ensure test is drawn and handled properly.   [facility] to inquire if they had been notified by [facility] about the above issue and whether they had contacted pt to make her aware. States they are aware of the issue and have notified the pt but have not received a returned call. Provided me with address for the closest [place] location. Also states they will continue efforts to each pt. Advised we will assist with their efforts as well.  Called pt and advised her about the above issues. Verbalized acceptance and understanding. However, pt advised she will not be able to go to [facility] this week d/t lack of time needed in order to take time off from work. Advised she call our office to make [place] aware of when she chooses to proceed to [place]. Will be happy to fax orders to [place], verbalized acceptance and understanding. Will await pt call.

## 2021-05-15 ENCOUNTER — Encounter: Payer: Self-pay | Admitting: Certified Registered Nurse Anesthetist

## 2021-05-16 ENCOUNTER — Ambulatory Visit (AMBULATORY_SURGERY_CENTER): Payer: 59 | Admitting: Gastroenterology

## 2021-05-16 ENCOUNTER — Other Ambulatory Visit: Payer: Self-pay

## 2021-05-16 ENCOUNTER — Encounter: Payer: Self-pay | Admitting: Gastroenterology

## 2021-05-16 VITALS — BP 104/63 | HR 55 | Temp 97.5°F | Resp 11 | Ht 62.0 in | Wt 142.0 lb

## 2021-05-16 DIAGNOSIS — K635 Polyp of colon: Secondary | ICD-10-CM

## 2021-05-16 DIAGNOSIS — Z86010 Personal history of colonic polyps: Secondary | ICD-10-CM

## 2021-05-16 DIAGNOSIS — D122 Benign neoplasm of ascending colon: Secondary | ICD-10-CM

## 2021-05-16 DIAGNOSIS — R140 Abdominal distension (gaseous): Secondary | ICD-10-CM

## 2021-05-16 MED ORDER — SODIUM CHLORIDE 0.9 % IV SOLN: 500.0000 mL | Freq: Once | INTRAVENOUS | Status: DC

## 2021-05-16 NOTE — Progress Notes (Signed)
Flasher relieves Monday CRNA

## 2021-05-16 NOTE — Patient Instructions (Signed)
Information on polyps and diverticulosis given to you today.  Await pathology results.  Resume previous diet and medications.  YOU HAD AN ENDOSCOPIC PROCEDURE TODAY AT THE  ENDOSCOPY CENTER:   Refer to the procedure report that was given to you for any specific questions about what was found during the examination.  If the procedure report does not answer your questions, please call your gastroenterologist to clarify.  If you requested that your care partner not be given the details of your procedure findings, then the procedure report has been included in a sealed envelope for you to review at your convenience later.  YOU SHOULD EXPECT: Some feelings of bloating in the abdomen. Passage of more gas than usual.  Walking can help get rid of the air that was put into your GI tract during the procedure and reduce the bloating. If you had a lower endoscopy (such as a colonoscopy or flexible sigmoidoscopy) you may notice spotting of blood in your stool or on the toilet paper. If you underwent a bowel prep for your procedure, you may not have a normal bowel movement for a few days.  Please Note:  You might notice some irritation and congestion in your nose or some drainage.  This is from the oxygen used during your procedure.  There is no need for concern and it should clear up in a day or so.  SYMPTOMS TO REPORT IMMEDIATELY:   Following lower endoscopy (colonoscopy or flexible sigmoidoscopy):  Excessive amounts of blood in the stool  Significant tenderness or worsening of abdominal pains  Swelling of the abdomen that is new, acute  Fever of 100F or higher   For urgent or emergent issues, a gastroenterologist can be reached at any hour by calling [phone]. Do not use MyChart messaging for urgent concerns.    DIET:  We do recommend a small meal at first, but then you may proceed to your regular diet.  Drink plenty of fluids but you should avoid alcoholic beverages for 24  hours.  ACTIVITY:  You should plan to take it easy for the rest of today and you should NOT DRIVE or use heavy machinery until tomorrow (because of the sedation medicines used during the test).    FOLLOW UP: Our staff will call the number listed on your records 48-72 hours following your procedure to check on you and address any questions or concerns that you may have regarding the information given to you following your procedure. If we do not reach you, we will leave a message.  We will attempt to reach you two times.  During this call, we will ask if you have developed any symptoms of COVID 19. If you develop any symptoms (ie: fever, flu-like symptoms, shortness of breath, cough etc.) before then, please call [phone].  If you test positive for Covid 19 in the 2 weeks post procedure, please call and report this information to us.    If any biopsies were taken you will be contacted by phone or by letter within the next 1-3 weeks.  Please call us at [phone] if you have not heard about the biopsies in 3 weeks.    SIGNATURES/CONFIDENTIALITY: You and/or your care partner have signed paperwork which will be entered into your electronic medical record.  These signatures attest to the fact that that the information above on your After Visit Summary has been reviewed and is understood.  Full responsibility of the confidentiality of this discharge information lies with you and/or   your care-partner. 

## 2021-05-16 NOTE — Progress Notes (Signed)
Called to room to assist during endoscopic procedure.  Patient ID and intended procedure confirmed with present staff. Received instructions for my participation in the procedure from the performing physician.  

## 2021-05-16 NOTE — Op Note (Signed)
[facility] Patient Name: Veronica Schwartz Procedure Date: 05/16/2021 7:27 AM MRN: [id number] Endoscopist: Thornton Park MD, MD Age: 55 Referring MD:  Date of Birth: Jul 05, 1966 Gender: Female Account #: [id number] Procedure:                Colonoscopy Indications:              Surveillance: Personal history of colonic polyps                            (unknown histology) on last colonoscopy 5 years ago                           Colonoscopy in CT 03/27/2016 (results reviewed today)                            77m tubular adenoma, fair/poor prep Medicines:                Monitored Anesthesia Care Procedure:                Pre-Anesthesia Assessment:                           - Prior to the procedure, a History and Physical                            was performed, and patient medications and                            allergies were reviewed. The patient's tolerance of                            previous anesthesia was also reviewed. The risks                            and benefits of the procedure and the sedation                            options and risks were discussed with the patient.                            All questions were answered, and informed consent                            was obtained. Prior Anticoagulants: The patient has                            taken no previous anticoagulant or antiplatelet                            agents. ASA Grade Assessment: II - A patient with                            mild systemic disease. After reviewing the risks  and benefits, the patient was deemed in                            satisfactory condition to undergo the procedure.                           After obtaining informed consent, the colonoscope                            was passed under direct vision. Throughout the                            procedure, the patient's blood pressure, pulse, and                            oxygen saturations  were monitored continuously. The                            Olympus CF-HQ190L [id number]) Colonoscope was                            introduced through the anus and advanced to the 4                            cm into the ileum. A second forward view of the                            right colon was performed. The colonoscopy was                            performed without difficulty. The patient tolerated                            the procedure well. The quality of the bowel                            preparation was good. The terminal ileum, ileocecal                            valve, appendiceal orifice, and rectum were                            photographed. Scope In: 7:33:54 AM Scope Out: [id number] AM Scope Withdrawal Time: 0 hours 12 minutes 1 second  Total Procedure Duration: 0 hours 15 minutes 55 seconds  Findings:                 The perianal and digital rectal examinations were                            normal.                           A 2 mm polyp was found in the proximal ascending  colon. The polyp was sessile. The polyp was removed                            with a cold snare. Resection and retrieval were                            complete. Estimated blood loss was minimal.                           A diffuse area of mildly melanotic mucosa was found                            in the entire colon.                           A few small-mouthed diverticula were found in the                            sigmoid colon. Complications:            No immediate complications. Estimated Blood Loss:     Estimated blood loss was minimal. Impression:               - One 2 mm polyp in the proximal ascending colon,                            removed with a cold snare. Resected and retrieved.                           - Diffuse melanosis coli. Recommendation:           - Patient has a contact number available for                            emergencies. The  signs and symptoms of potential                            delayed complications were discussed with the                            patient. Return to normal activities tomorrow.                            Written discharge instructions were provided to the                            patient.                           - Resume previous diet. High fiber diet recommended.                           - Continue present medications.                           - Await pathology results.                           -  Repeat colonoscopy date to be determined after                            pending pathology results are reviewed for                            surveillance. Plan two day bowel prep at that time.                           - Emerging evidence supports eating a diet of                            fruits, vegetables, grains, calcium, and yogurt                            while reducing red meat and alcohol may reduce the                            risk of colon cancer.                           - Thank you for allowing me to be involved in your                            colon cancer prevention. Thornton Park MD, MD 05/16/2021 7:58:25 AM This report has been signed electronically.

## 2021-05-16 NOTE — Progress Notes (Signed)
A/ox3, pleased with MAC, report to RN 

## 2021-05-16 NOTE — Progress Notes (Signed)
Pt's states no medical or surgical changes since previsit or office visit. 

## 2021-05-20 ENCOUNTER — Telehealth: Payer: Self-pay | Admitting: *Deleted

## 2021-05-20 NOTE — Telephone Encounter (Signed)
Message left

## 2021-05-20 NOTE — Telephone Encounter (Signed)
Attempted 2nd f/u phone call. No answer. Left message.  °

## 2021-05-28 ENCOUNTER — Telehealth: Payer: Self-pay | Admitting: Gastroenterology

## 2021-05-28 NOTE — Telephone Encounter (Signed)
Review of outside records:  Colonoscopy from diagnostic Endo center [facility] dated 03/27/2016: Sessile polyp 0.6 cm removed from the descending colon.  Sessile polyp 0.6 cm removed from the ascending colon.  Diverticulosis of the large intestine without perforation, abscess or bleeding.  The bowel prep using Suprep was fair.  The colon was mildly tortuous.  Biopsies of the terminal ileum were negative.  Random colon biopsies showed colonic mucosa with lymphoid aggregates, negative for colitis, dysplasia or malignancy.  The transverse polyp was tubular adenomatous.  The descending colon polyp was a hyperplastic polyp.   An EGD was done on the same date which showed acute gastritis without bleeding, diaphragmatic hernia without obstruction, esophagitis, esophageal erosion and several small sessile polyp size of 1 cm in the duodenal bulb..  Duodenal biopsies showed duodenal mucosa with Brunner gland hyperplasia and gastric mucin cell metaplasia consistent with peptic duodenitis.  The duodenal mucosa shows a relatively normal villous architecture without increased intraepithelial lymphocytosis.  No evidence of celiac disease.  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

## 2021-06-02 ENCOUNTER — Encounter: Payer: Self-pay | Admitting: Gastroenterology

## 2021-07-21 ENCOUNTER — Other Ambulatory Visit: Payer: Self-pay | Admitting: Endocrinology

## 2021-07-21 DIAGNOSIS — E038 Other specified hypothyroidism: Secondary | ICD-10-CM

## 2021-10-16 ENCOUNTER — Other Ambulatory Visit: Payer: Self-pay | Admitting: Endocrinology

## 2021-12-25 ENCOUNTER — Other Ambulatory Visit: Payer: Self-pay

## 2021-12-25 ENCOUNTER — Ambulatory Visit (INDEPENDENT_AMBULATORY_CARE_PROVIDER_SITE_OTHER): Payer: 59 | Admitting: Endocrinology

## 2021-12-25 VITALS — BP 118/80 | HR 80 | Ht 62.0 in | Wt 147.2 lb

## 2021-12-25 DIAGNOSIS — E038 Other specified hypothyroidism: Secondary | ICD-10-CM

## 2021-12-25 DIAGNOSIS — E063 Autoimmune thyroiditis: Secondary | ICD-10-CM

## 2021-12-25 LAB — T4, FREE: Free T4: 1.15 ng/dL (ref 0.60–1.60)

## 2021-12-25 LAB — TSH: TSH: 1.69 u[IU]/mL (ref 0.35–5.50)

## 2021-12-25 MED ORDER — SYNTHROID 175 MCG PO TABS: 175.0000 ug | ORAL_TABLET | Freq: Every day | ORAL | 3 refills | Status: DC

## 2021-12-25 NOTE — Progress Notes (Signed)
? ?Subjective:  ? ? Patient ID: Veronica Schwartz, female    DOB: [birth year], 56 y.o.   MRN: [id number] ? ?HPI ?Pt returns for f/u of multinodular thyroid (dx'ed with hypothyroidism in 1983, and the goiter in 2018; she had right lobectomy in 2019 (pathol showed LYMPHOCYTIC THYROIDITIS WITH HURTHLE CELL CHANGE; she requires a full daily dosage of synthroid; she requests DAW).  She again reports weight gain.  She takes synthroid as rx'ed.   ?Past Medical History:  ?Diagnosis Date  ? Hypothyroidism   ? ? ?Past Surgical History:  ?Procedure Laterality Date  ? BREAST BIOPSY Left 2017  ? benign  ? CESAREAN SECTION    ? 2010  ? CHEST WALL BIOPSY    ? THYROID LOBECTOMY Right 08/17/2018  ? Procedure: RIGHT THYROID LOBECTOMY;  Surgeon: Armandina Gemma, MD;  Location: [facility];  Service: General;  Laterality: Right;  ? ? ?Social History  ? ?Socioeconomic History  ? Marital status: Divorced  ?  Spouse name: Not on file  ? Number of children: Not on file  ? Years of education: Not on file  ? Highest education level: Not on file  ?Occupational History  ? Not on file  ?Tobacco Use  ? Smoking status: Former  ?  Types: Cigarettes  ?  Quit date: 07/06/2016  ?  Years since quitting: 5.4  ? Smokeless tobacco: Never  ?Vaping Use  ? Vaping Use: Never used  ?Substance and Sexual Activity  ? Alcohol use: Yes  ?  Alcohol/week: 1.0 standard drink  ?  Types: 1 Glasses of wine per week  ?  Comment: occasional  ? Drug use: Never  ? Sexual activity: Not Currently  ?Other Topics Concern  ? Not on file  ?Social History Narrative  ? Not on file  ? ?Social Determinants of Health  ? ?Financial Resource Strain: Not on file  ?Food Insecurity: Not on file  ?Transportation Needs: Not on file  ?Physical Activity: Not on file  ?Stress: Not on file  ?Social Connections: Not on file  ?Intimate Partner Violence: Not on file  ? ? ?Current Outpatient Medications on File Prior to Visit  ?Medication Sig Dispense Refill  ? Cholecalciferol (VITAMIN D) 50 MCG (2000 UT) CAPS  Take 1 capsule by mouth daily.    ? Cyanocobalamin (VITAMIN B-12 PO) Take 1 each by mouth daily.    ? dicyclomine (BENTYL) 20 MG tablet Take 1 tablet (20 mg total) by mouth every 6 (six) hours. 120 tablet 2  ? linaclotide (LINZESS) 145 MCG CAPS capsule Take 1 capsule (145 mcg total) by mouth daily before breakfast. 30 capsule 1  ? MAGNESIUM PO Take 1,000 mg by mouth at bedtime.    ? Omega-3 Fatty Acids (FISH OIL) 1000 MG CAPS Take 2,000 mg by mouth daily.    ? Probiotic Product (PROBIOTIC PO) Take 1 capsule by mouth daily.    ? valACYclovir (VALTREX) 1000 MG tablet Take 1,000 mg by mouth daily.     ? ?No current facility-administered medications on file prior to visit.  ? ? ?Allergies  ?Allergen Reactions  ? Penicillins Other (See Comments)  ?  Fever convulsion ?Has patient had a PCN reaction causing immediate rash, facial/tongue/throat swelling, SOB or lightheadedness with hypotension: Unknown ?Has patient had a PCN reaction causing severe rash involving mucus membranes or skin necrosis: Unknown ?Has patient had a PCN reaction that required hospitalization: Unknown ?Has patient had a PCN reaction occurring within the last 10 years: No ?If all of the above  answers are "NO", then may proceed with Cephalosporin use. ?  ? ? ?Family History  ?Problem Relation Age of Onset  ? Breast cancer Mother 82  ? Colon cancer Maternal Aunt   ? Stomach cancer Paternal Aunt   ? Breast cancer Maternal Grandmother 60  ? Esophageal cancer Neg Hx   ? Pancreatic cancer Neg Hx   ? Rectal cancer Neg Hx   ? ? ?BP 118/80   Pulse 80   Ht '5\' 2"'$  (1.575 m)   Wt 147 lb 3.2 oz (66.8 kg)   LMP 07/15/2020   SpO2 97%   BMI 26.92 kg/m?  ? ? ?Review of Systems ? ?   ?Objective:  ? Physical Exam ?VITAL SIGNS:  See vs page ?GENERAL: no distress ?Neck: a healed scar is present.  I do not appreciate a nodule in the thyroid or elsewhere in the neck.   ? ?Lab Results  ?Component Value Date  ? TSH 1.69 12/25/2021  ? ? ?   ?Assessment & Plan:   ?Hypothyroidism: well-controlled.  Please continue the same synthroid.  ? ?

## 2021-12-25 NOTE — Patient Instructions (Signed)
Blood tests are requested for you today.  We'll let you know about the results.   ?It is best to never miss the medication.  However, if you do miss it, next best is to double up the next time.   ?Please come back for a follow-up appointment in 1 year.   ? ?

## 2022-02-08 IMAGING — US US ABDOMEN COMPLETE
1 series · 15 of 25 positions shown · non-contrast
Comparison: None.

CLINICAL DATA: Right upper quadrant abdominal pain, abdominal
bloating, unintentional weight gain

EXAM:
ABDOMEN ULTRASOUND COMPLETE

[Series 1: us abdomen complete mc & wl · 15 of 71 slices shown]
[im 1/71]
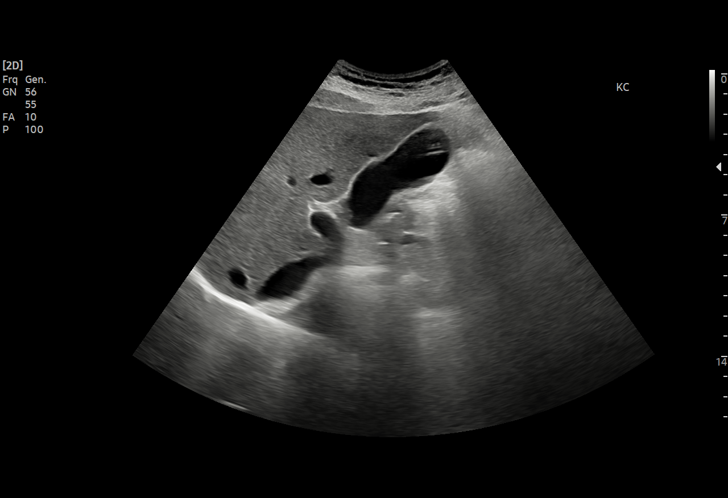
[im 6/71]
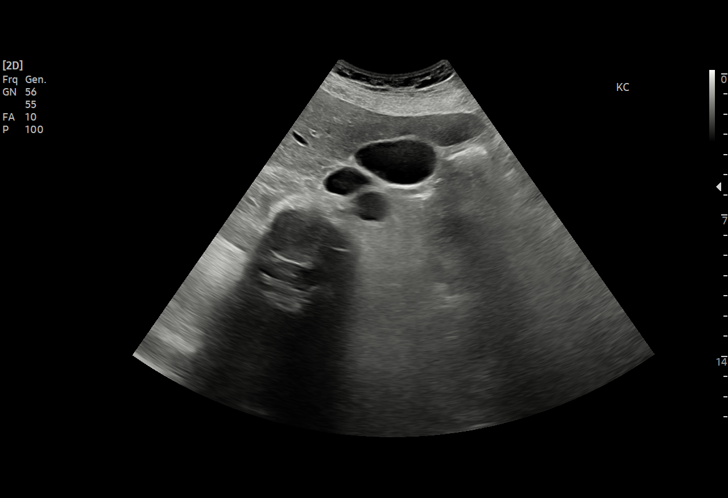
[im 12/71]
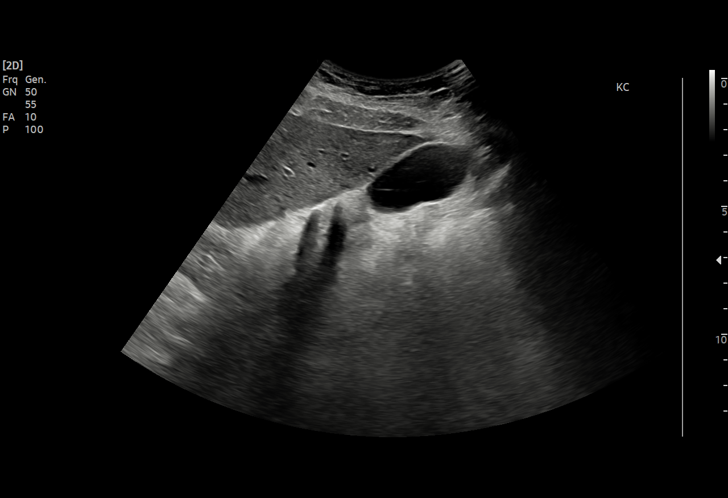
[im 15/71]
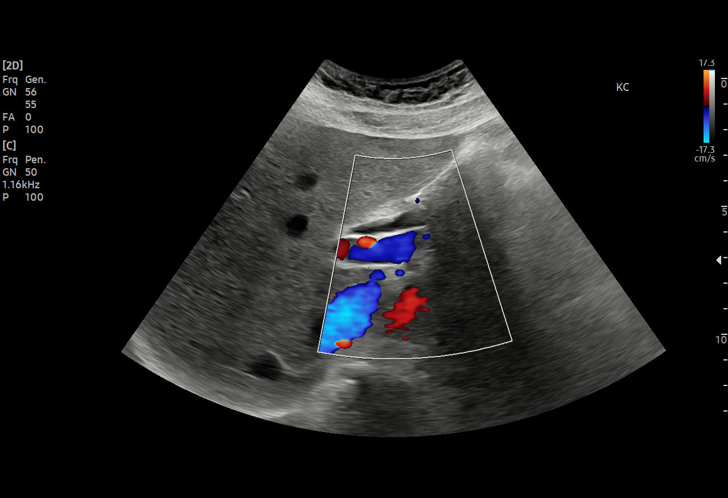
[im 21/71]
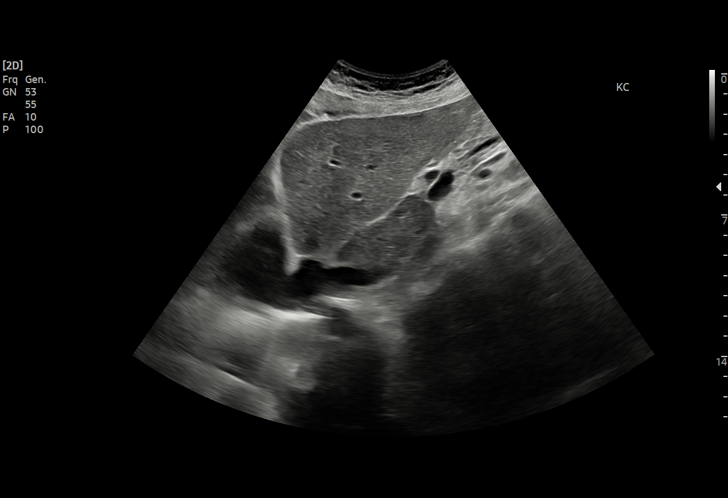
[im 27/71]
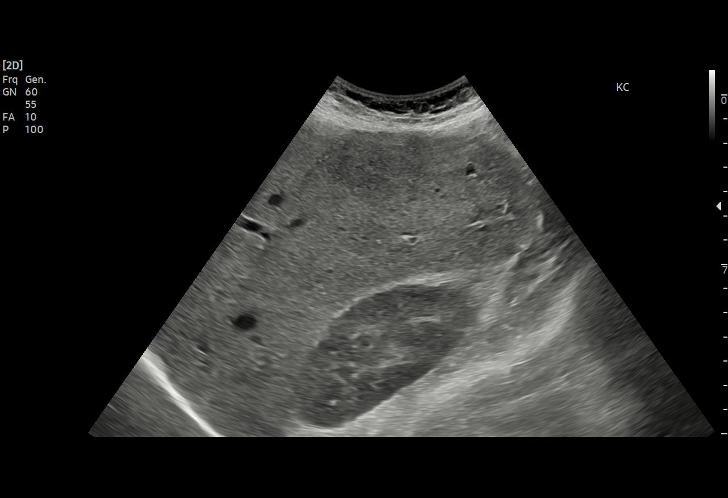
[im 30/71]
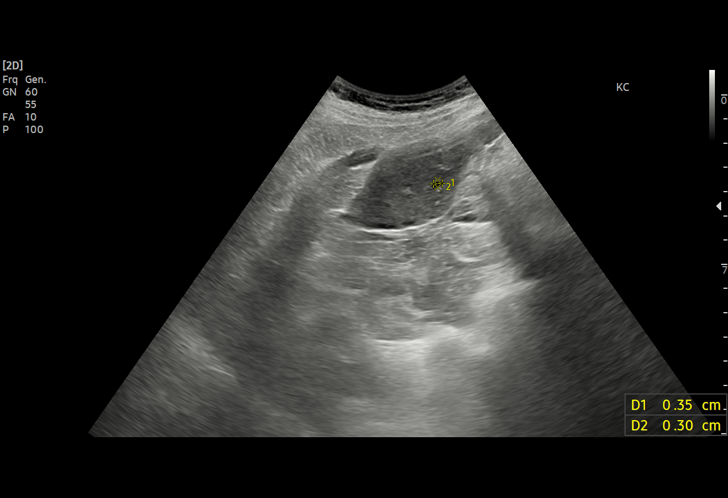
[im 36/71]
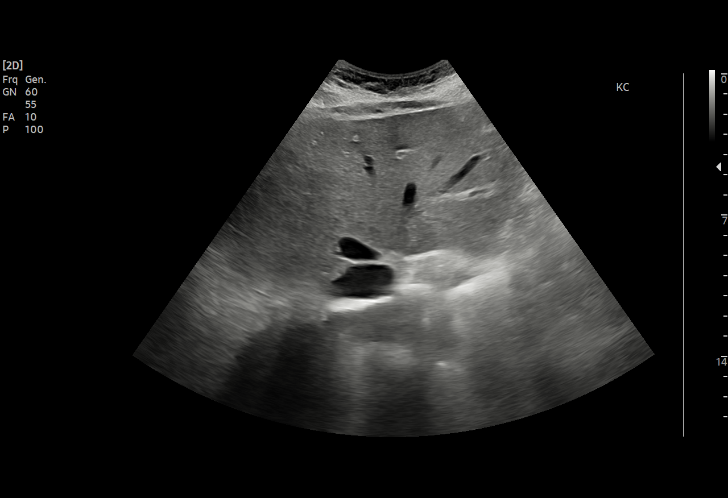
[im 41/71]
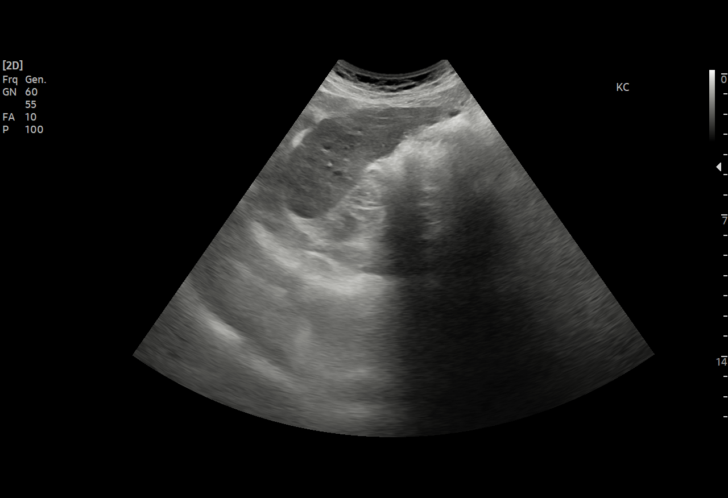
[im 44/71]
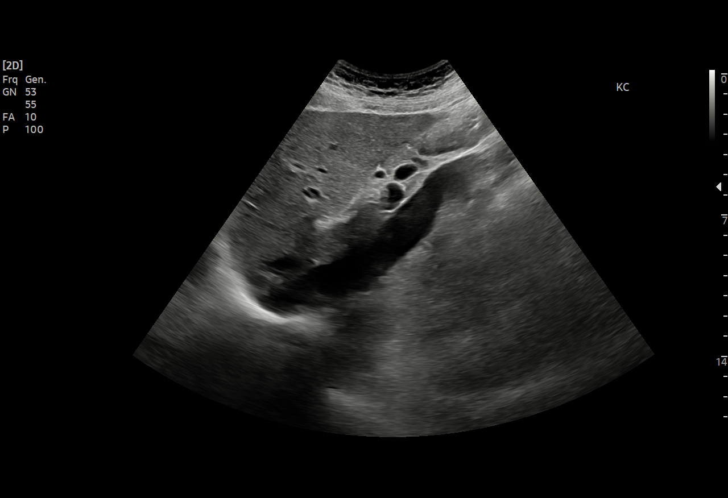
[im 50/71]
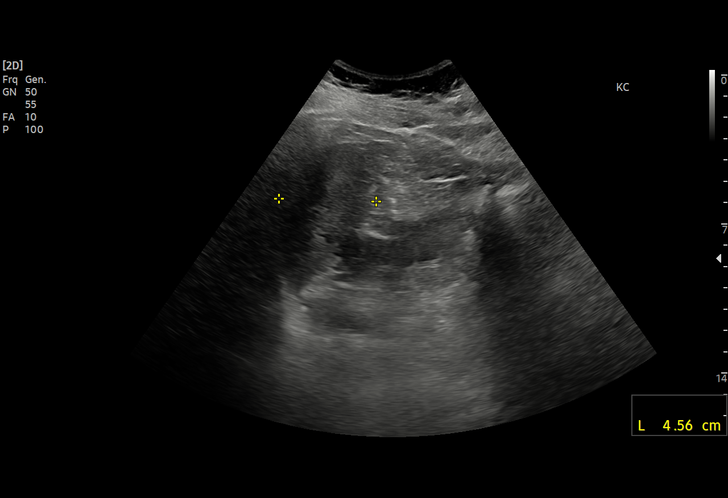
[im 56/71]
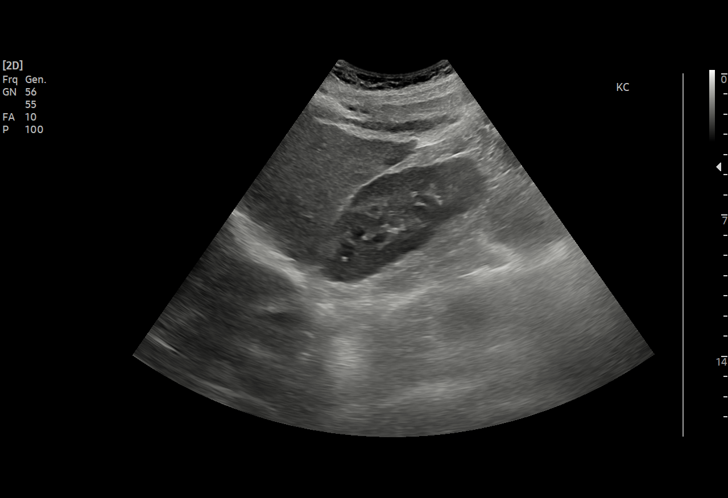
[im 59/71]
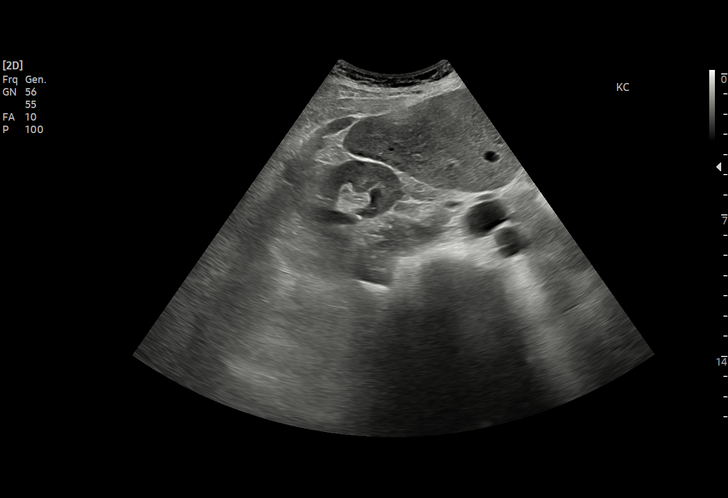
[im 65/71]
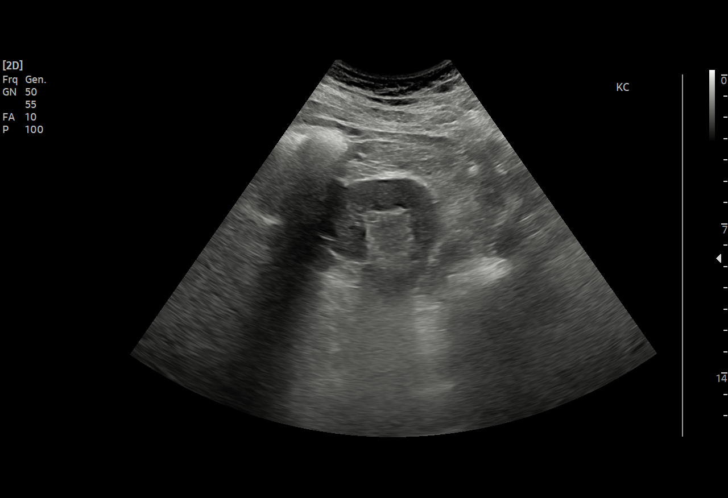
[im 71/71]
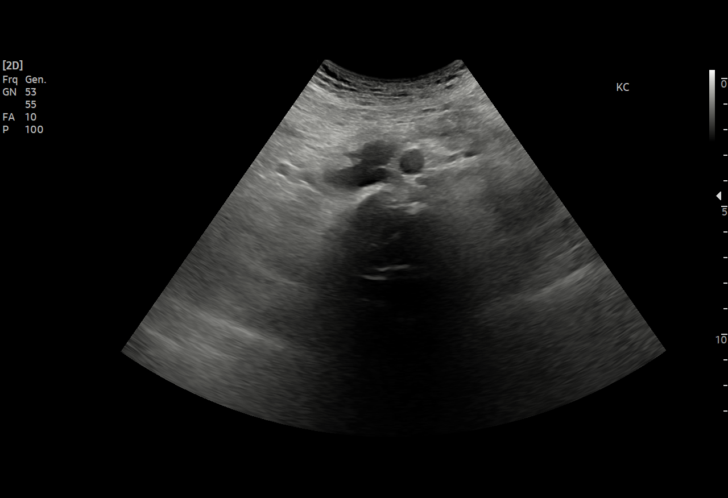

[15 of 25 positions shown; findings below may reference images not displayed]

FINDINGS: Gallbladder: No gallstones or wall thickening visualized. No
sonographic Murphy sign noted by sonographer.

Common bile duct: Diameter: 6 mm in proximal diameter

Liver: Hepatic parenchymal echogenicity and echotexture is normal. A
5 mm simple cyst is seen within the inferior right hepatic lobe. No
solid intrahepatic mass identified. No intrahepatic biliary ductal
dilation. Portal vein is patent on color Doppler imaging with normal
direction of blood flow towards the liver.

IVC: No abnormality visualized.

Pancreas: Visualized portion unremarkable.

Spleen: Size and appearance within normal limits.

Right Kidney: Length: 10.6 cm. Echogenicity within normal limits. No
mass or hydronephrosis visualized.

Left Kidney: Length: 10.6 cm. Echogenicity within normal limits. No
mass or hydronephrosis visualized.

Abdominal aorta: No aneurysm visualized. Mild atherosclerotic plaque
is seen within the distal abdominal aorta.

Other findings: No ascites
IMPRESSION: No acute abnormality identified.

Aortic Atherosclerosis ([id number].J).

## 2022-02-26 IMAGING — CT CT ABD-PELV W/ CM
2 of 5 series · 15 of 46 positions shown, 17 images · IV contrast (OMNIPAQUE 300)
Comparison: Abdominal ultrasound 04/10/2021

CLINICAL DATA: Right upper quadrant abdominal pain with bloating
and constipation.

EXAM:
CT ABDOMEN AND PELVIS WITH CONTRAST
TECHNIQUE: Multidetector CT imaging of the abdomen and pelvis was performed
using the standard protocol following bolus administration of
intravenous contrast.
CONTRAST:  100mL OMNIPAQUE IOHEXOL 300 MG/ML  SOLN

[Series 2: abd/pel w · axial · 0.66mm/px · z∈[+1197,+1602]mm · 12 of 91 slices shown, 14 images]
[im 5/91  soft-tissue]
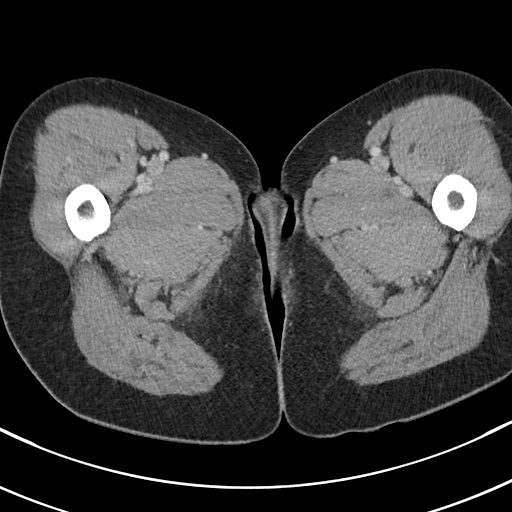
[im 5/91  bone]
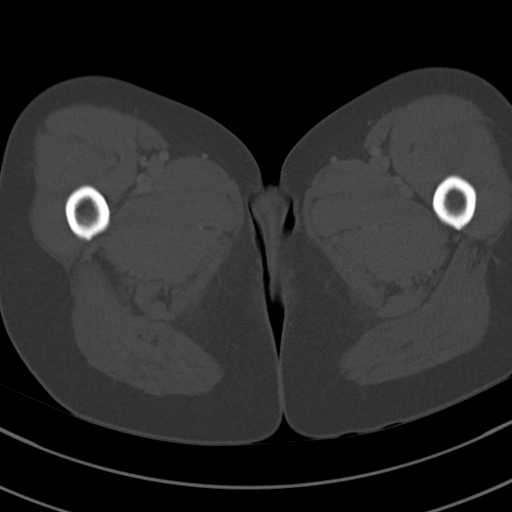
[im 15/91  soft-tissue]
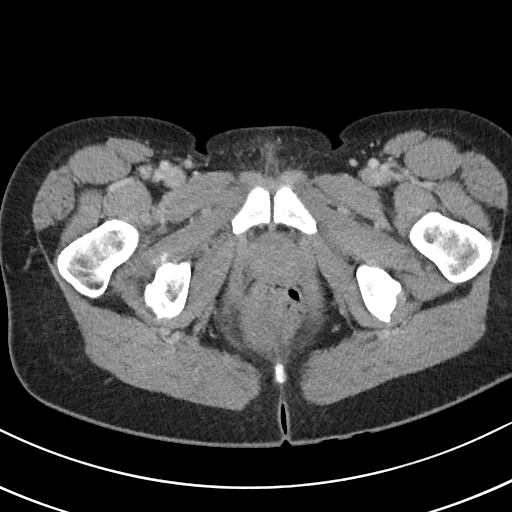
[im 19/91  soft-tissue]
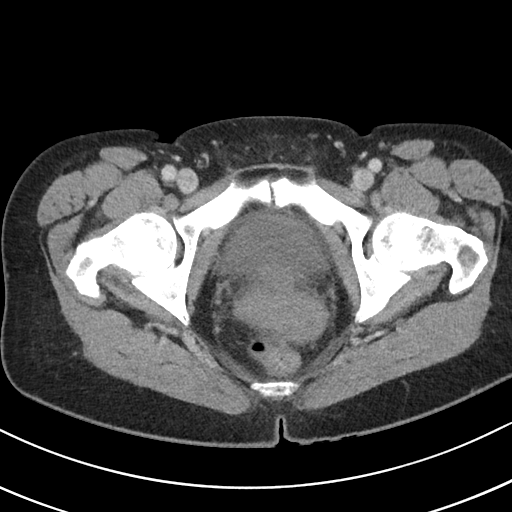
[im 29/91  soft-tissue]
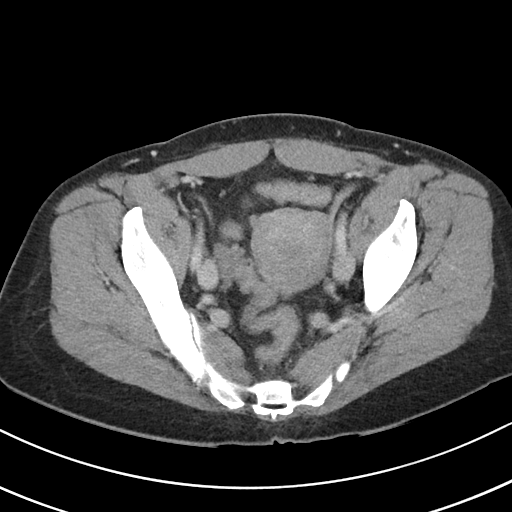
[im 34/91  soft-tissue]
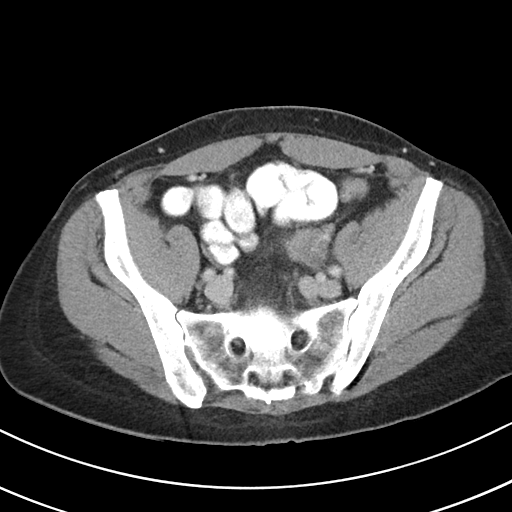
[im 43/91  soft-tissue]
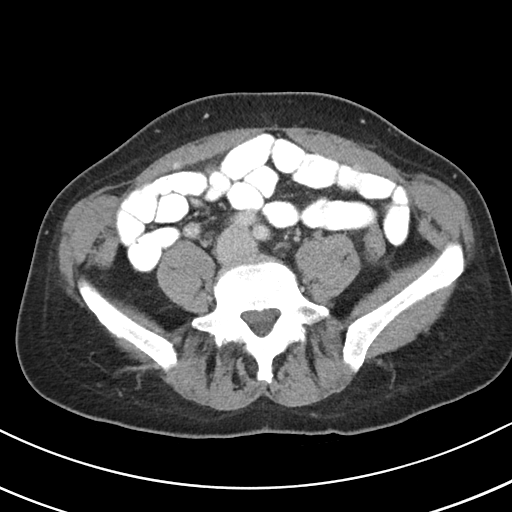
[im 48/91  soft-tissue]
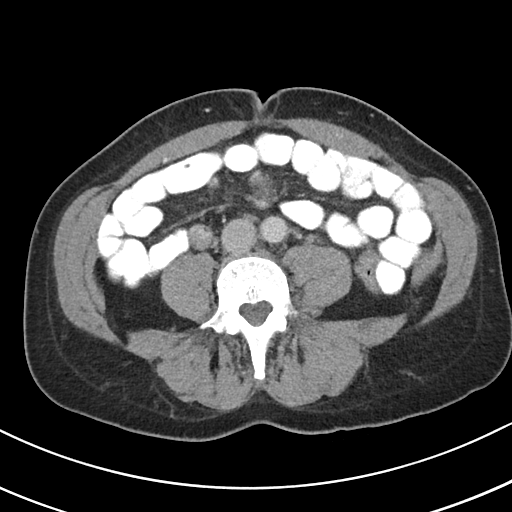
[im 57/91  soft-tissue]
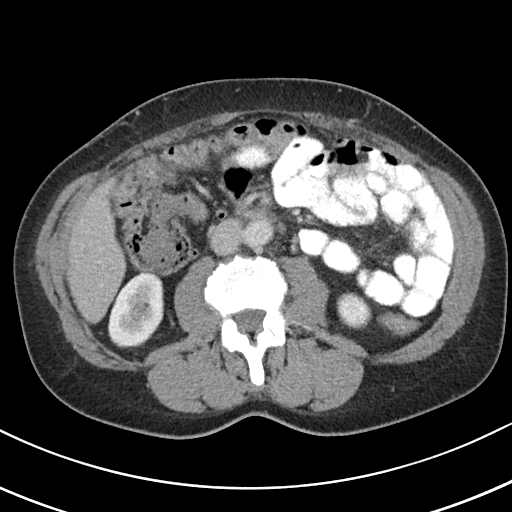
[im 62/91  soft-tissue]
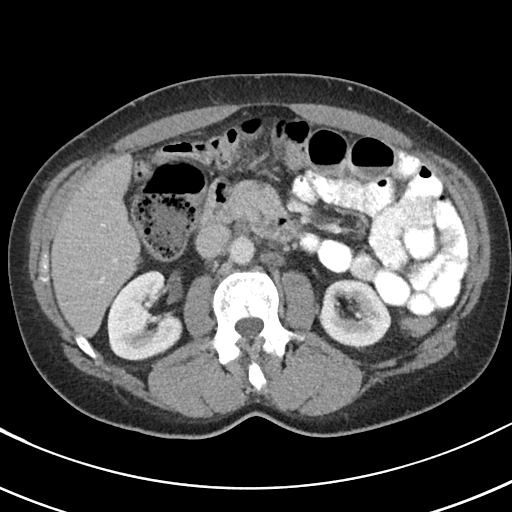
[im 62/91  bone]
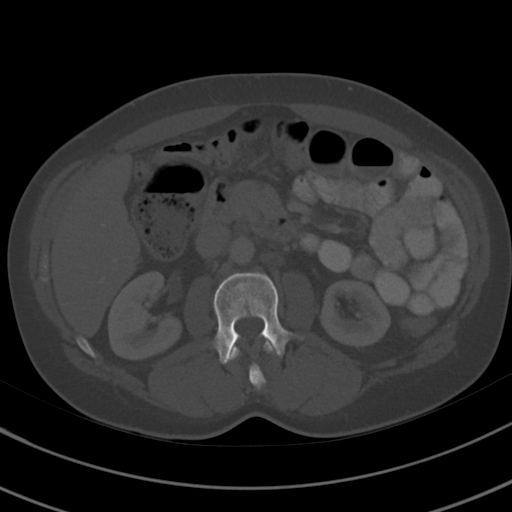
[im 72/91  soft-tissue]
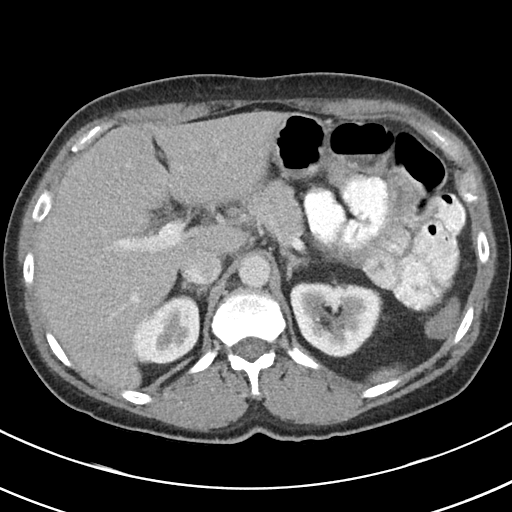
[im 76/91  soft-tissue]
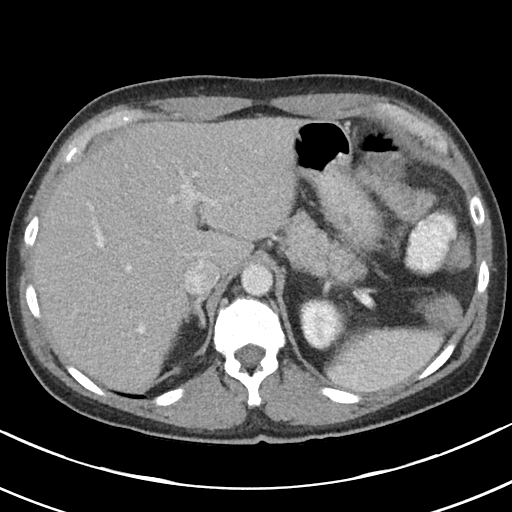
[im 86/91  soft-tissue]
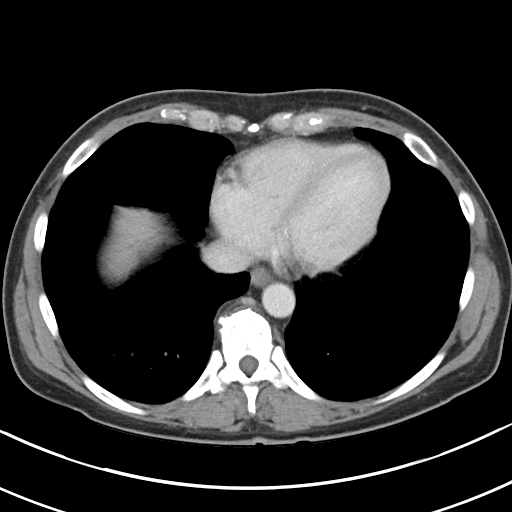

[Series 5: coronal st · coronal · 0.74mm/px · 3 of 87 slices shown]
[im 29/87  soft-tissue]
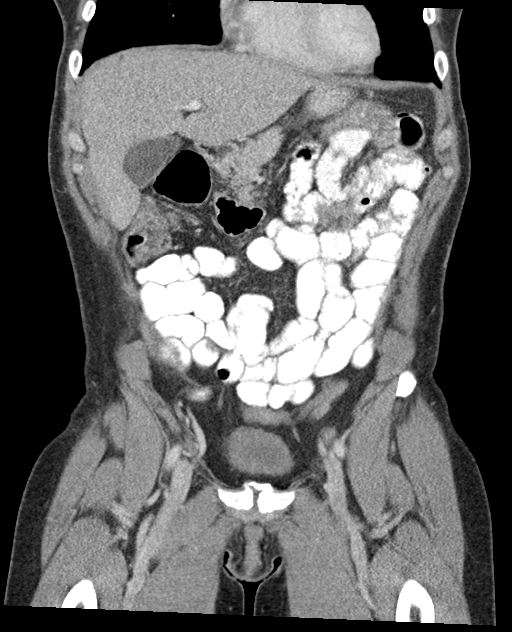
[im 39/87  soft-tissue]
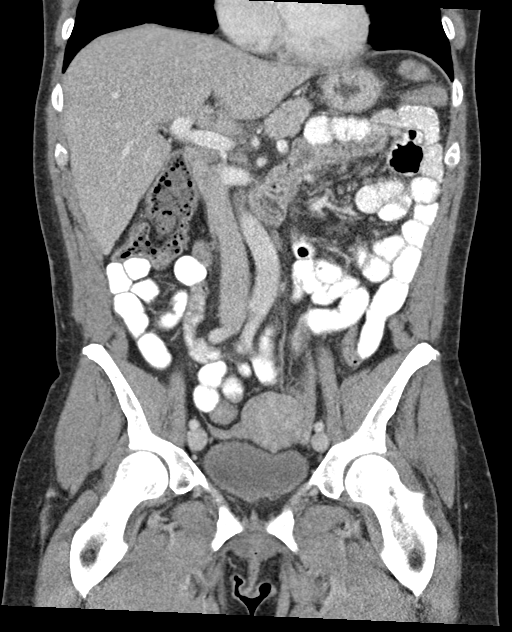
[im 48/87  soft-tissue]
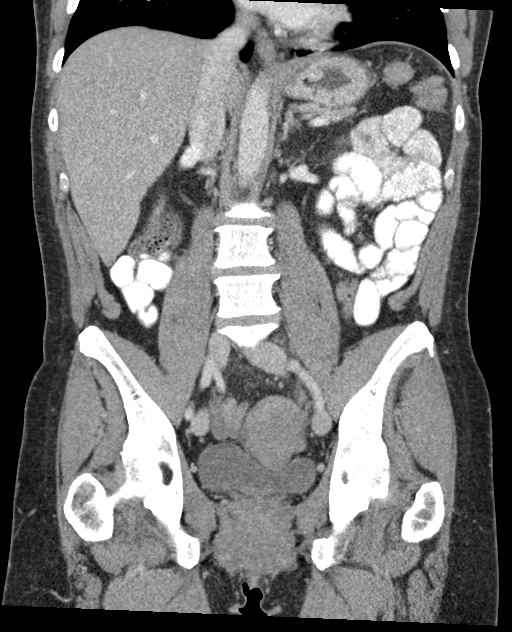

[15 of 46 positions shown; findings below may reference images not displayed]

FINDINGS: Lower chest: Unremarkable

Hepatobiliary: Three tiny hypodense lesions are present in the liver
measuring up to 0.5 cm in diameter, technically nonspecific although
statistically likely to be cysts. One of these was seen on recent
ultrasound and was characterized as a cyst. Gallbladder
unremarkable. Common bile duct 6 mm in diameter, similar to recent
ultrasound, and borderline prominent.

Pancreas: Unremarkable

Spleen: Unremarkable

Adrenals/Urinary Tract: Both adrenal glands appear normal. Mild
scarring of the left kidney lower pole. Two tiny hypodense lesions
in the right kidney are technically too small to characterize
although statistically likely to be small cysts.

Stomach/Bowel: Borderline dilated loop of left upper quadrant small
bowel with air-fluid levels, measuring about 2.9 cm in diameter, but
without a specific lead point for obstruction, probably incidental
normal appendix. Scattered sigmoid colon diverticula.

Vascular/Lymphatic: Minimal atherosclerotic calcification in the
right common iliac artery. No pathologic adenopathy.

Reproductive: 3.4 by 2.0 by 1.9 cm fluid density simple appearing
right ovarian/adnexal cystic lesion

Other: No supplemental non-categorized findings.

Musculoskeletal: Degenerative facet arthropathy on the right at
L5-S1 without observed impingement.

No upper abdominal or Morgagni hernia is identified. No specific
subcostal abnormality observed.
IMPRESSION: 1. A specific cause for the patient's right upper quadrant abdominal
pain and bloating is not identified. No specific subcostal lesion or
herniation is observed.
2. Currently there is no prominence of stool in the colon.
3. Simple appearing right ovarian cystic lesion measuring up to
cm in long axis. Recommend follow-up US in 6-12 months. Note: This
recommendation does not apply to premenarchal patients and to those
with increased risk (genetic, family history, elevated tumor markers
or other high-risk factors) of ovarian cancer. Reference: JACR [DATE]):[id number]
4. Small hypodense hepatic and right renal lesions are likely cysts
although technically too small to characterize.
5. Borderline dilated loop of left upper quadrant small bowel, most
likely incidental, less likely due to local ileus.

## 2022-05-29 ENCOUNTER — Other Ambulatory Visit: Payer: Self-pay

## 2022-05-29 DIAGNOSIS — E038 Other specified hypothyroidism: Secondary | ICD-10-CM

## 2022-05-29 MED ORDER — SYNTHROID 175 MCG PO TABS: 175.0000 ug | ORAL_TABLET | Freq: Every day | ORAL | 3 refills | Status: DC

## 2022-07-21 ENCOUNTER — Ambulatory Visit: Payer: 59 | Admitting: Gastroenterology

## 2022-07-21 ENCOUNTER — Telehealth: Payer: Self-pay | Admitting: Gastroenterology

## 2022-07-21 NOTE — Telephone Encounter (Signed)
Good Morning Dr. Tarri Glenn,  Patient called stating that she needed to reschedule her appointment with you today at 11:10 due to waking up sick.    Patient was rescheduled for 11/16 at 11:10.

## 2022-09-03 ENCOUNTER — Ambulatory Visit: Payer: 59 | Admitting: Gastroenterology

## 2022-09-18 ENCOUNTER — Ambulatory Visit (INDEPENDENT_AMBULATORY_CARE_PROVIDER_SITE_OTHER): Payer: 59 | Admitting: Internal Medicine

## 2022-09-18 ENCOUNTER — Encounter: Payer: Self-pay | Admitting: Internal Medicine

## 2022-09-18 VITALS — BP 112/80 | HR 71 | Temp 99.0°F | Ht 62.0 in | Wt 143.0 lb

## 2022-09-18 DIAGNOSIS — E063 Autoimmune thyroiditis: Secondary | ICD-10-CM | POA: Diagnosis not present

## 2022-09-18 DIAGNOSIS — Z0001 Encounter for general adult medical examination with abnormal findings: Secondary | ICD-10-CM

## 2022-09-18 DIAGNOSIS — E038 Other specified hypothyroidism: Secondary | ICD-10-CM | POA: Diagnosis not present

## 2022-09-18 DIAGNOSIS — Z8249 Family history of ischemic heart disease and other diseases of the circulatory system: Secondary | ICD-10-CM | POA: Diagnosis not present

## 2022-09-18 DIAGNOSIS — N83201 Unspecified ovarian cyst, right side: Secondary | ICD-10-CM | POA: Insufficient documentation

## 2022-09-18 DIAGNOSIS — E041 Nontoxic single thyroid nodule: Secondary | ICD-10-CM | POA: Diagnosis not present

## 2022-09-18 DIAGNOSIS — Z0000 Encounter for general adult medical examination without abnormal findings: Secondary | ICD-10-CM | POA: Diagnosis not present

## 2022-09-18 DIAGNOSIS — Z23 Encounter for immunization: Secondary | ICD-10-CM

## 2022-09-18 LAB — CBC
HCT: 41.6 % (ref 36.0–46.0)
Hemoglobin: 14.6 g/dL (ref 12.0–15.0)
MCHC: 35.1 g/dL (ref 30.0–36.0)
MCV: 104.1 fl — ABNORMAL HIGH (ref 78.0–100.0)
Platelets: 400 10*3/uL (ref 150.0–400.0)
RBC: 4 Mil/uL (ref 3.87–5.11)
RDW: 12.7 % (ref 11.5–15.5)
WBC: 5.7 10*3/uL (ref 4.0–10.5)

## 2022-09-18 LAB — COMPREHENSIVE METABOLIC PANEL
ALT: 15 U/L (ref 0–35)
AST: 20 U/L (ref 0–37)
Albumin: 4.4 g/dL (ref 3.5–5.2)
Alkaline Phosphatase: 36 U/L — ABNORMAL LOW (ref 39–117)
BUN: 14 mg/dL (ref 6–23)
CO2: 30 mEq/L (ref 19–32)
Calcium: 9.6 mg/dL (ref 8.4–10.5)
Chloride: 104 mEq/L (ref 96–112)
Creatinine, Ser: 0.73 mg/dL (ref 0.40–1.20)
GFR: 92.04 mL/min (ref >60.00)
Glucose, Bld: 93 mg/dL (ref 70–99)
Potassium: 5.2 mEq/L — ABNORMAL HIGH (ref 3.5–5.1)
Sodium: 140 mEq/L (ref 135–145)
Total Bilirubin: 0.7 mg/dL (ref 0.2–1.2)
Total Protein: 6.8 g/dL (ref 6.0–8.3)

## 2022-09-18 LAB — LIPID PANEL
Cholesterol: 195 mg/dL (ref 0–200)
HDL: 67.5 mg/dL (ref >39.00)
LDL Cholesterol: 108 mg/dL — ABNORMAL HIGH (ref 0–99)
NonHDL: 127.04
Total CHOL/HDL Ratio: 3
Triglycerides: 93 mg/dL (ref 0.0–149.0)
VLDL: 18.6 mg/dL (ref 0.0–40.0)

## 2022-09-18 LAB — T4, FREE: Free T4: 1.46 ng/dL (ref 0.60–1.60)

## 2022-09-18 LAB — TSH: TSH: 0.16 u[IU]/mL — ABNORMAL LOW (ref 0.35–5.50)

## 2022-09-18 NOTE — Assessment & Plan Note (Signed)
Flu shot declines. Covid-19 counseled. Shingrix given 1st today. Tetanus up to date. Colonoscopy up to date. Mammogram  up to date with gyn getting records, pap smear up to date with gyn getting records. Counseled about sun safety and mole surveillance. Counseled about the dangers of distracted driving. Given 10 year screening recommendations.

## 2022-09-18 NOTE — Assessment & Plan Note (Signed)
Checking TSH and free T4 and adjust synthroid 175 mcg daily as needed.  

## 2022-09-18 NOTE — Assessment & Plan Note (Signed)
Checking lipid panel and CBC and CMP. BP at goal. Adjust as needed she is taking fish oil otc.

## 2022-09-18 NOTE — Assessment & Plan Note (Signed)
Labs stable for some time. S/P lobectomy and this was benign.

## 2022-09-18 NOTE — Progress Notes (Signed)
   Subjective:   Patient ID: Veronica Schwartz, female    DOB: 1965-10-22, 56 y.o.   MRN: [id number]  HPI The patient is a 56 YO female coming in for [facility] for physical and ongoing care.  PMH, [facility], social history reviewed and updated  Review of Systems  Constitutional: Negative.   HENT: Negative.    Eyes: Negative.   Respiratory:  Negative for cough, chest tightness and shortness of breath.   Cardiovascular:  Negative for chest pain, palpitations and leg swelling.  Gastrointestinal:  Negative for abdominal distention, abdominal pain, constipation, diarrhea, nausea and vomiting.  Musculoskeletal: Negative.   Skin: Negative.   Neurological: Negative.   Psychiatric/Behavioral: Negative.      Objective:  Physical Exam Constitutional:      Appearance: She is well-developed.  HENT:     Head: Normocephalic and atraumatic.  Cardiovascular:     Rate and Rhythm: Normal rate and regular rhythm.  Pulmonary:     Effort: Pulmonary effort is normal. No respiratory distress.     Breath sounds: Normal breath sounds. No wheezing or rales.  Abdominal:     General: Bowel sounds are normal. There is no distension.     Palpations: Abdomen is soft.     Tenderness: There is no abdominal tenderness. There is no rebound.  Musculoskeletal:     Cervical back: Normal range of motion.  Skin:    General: Skin is warm and dry.  Neurological:     Mental Status: She is alert and oriented to person, place, and time.     Coordination: Coordination normal.     Vitals:   09/18/22 0823  BP: 112/80  Pulse: 71  Temp: 99 F (37.2 C)  TempSrc: Oral  SpO2: 95%  Weight: 143 lb (64.9 kg)  Height: '5\' 2"'$  (1.575 m)    Assessment & Plan:  Shingrix IM given at visit

## 2022-09-18 NOTE — Assessment & Plan Note (Signed)
Noted on CT scan from GI about 1.5 years ago and she was not informed and follow up not done. Ordered US pelvis including transvaginal if needed. Treat as appropriate.

## 2022-10-02 ENCOUNTER — Ambulatory Visit
Admission: RE | Admit: 2022-10-02 | Discharge: 2022-10-02 | Disposition: A | Discharge status: Home / Self Care | Payer: 59 | Source: Ambulatory Visit | Attending: Internal Medicine | Admitting: Internal Medicine

## 2022-10-02 DIAGNOSIS — N83201 Unspecified ovarian cyst, right side: Secondary | ICD-10-CM

## 2022-10-05 ENCOUNTER — Encounter: Payer: Self-pay | Admitting: Internal Medicine

## 2022-10-28 ENCOUNTER — Ambulatory Visit (INDEPENDENT_AMBULATORY_CARE_PROVIDER_SITE_OTHER): Payer: 59 | Admitting: Gastroenterology

## 2022-10-28 ENCOUNTER — Encounter: Payer: Self-pay | Admitting: Gastroenterology

## 2022-10-28 DIAGNOSIS — R140 Abdominal distension (gaseous): Secondary | ICD-10-CM

## 2022-10-28 DIAGNOSIS — R1011 Right upper quadrant pain: Secondary | ICD-10-CM

## 2022-10-28 DIAGNOSIS — R194 Change in bowel habit: Secondary | ICD-10-CM | POA: Diagnosis not present

## 2022-10-28 MED ORDER — DICYCLOMINE HCL 20 MG PO TABS: 20.0000 mg | ORAL_TABLET | Freq: Four times a day (QID) | ORAL | 2 refills | Status: AC

## 2022-10-28 MED ORDER — LINACLOTIDE 145 MCG PO CAPS: 145.0000 ug | ORAL_CAPSULE | Freq: Every day | ORAL | 1 refills | Status: DC

## 2022-10-28 NOTE — Patient Instructions (Addendum)
It was a pleasure to meet you today.   I would like to review the labs that you had performed today. I am also recommending labs for celiac disease.   We discussed the following options for evaluation and treatment of bloating and distension:  - Constipation: Improving constipation may make your bloating and distension better. We will try to restart the Linzess and see if your insurance approves. - Antispasmodics: If you are interested in trying prescription medications, a trial of antispasmodic with dicyclomine 20 mg four times daily could be considered.  - Neuromodulators: We could try buspirone 10 mg three times daily  Let's work through these options to try to get you feeling better.  I would like to see you back in 6-8 weeks, earlier if necessary. If your symptoms are not improving, we will want to consider an upper endoscopy and colonoscopy for further evaluation.  We can also always consider a CT scan for further evaluation, although your recent pelvic ultrasound provides some reassurance.

## 2022-10-28 NOTE — Progress Notes (Signed)
Referring Provider: Marrian Salvage,* Primary Care Physician:  Hoyt Koch, MD  Chief complaint:  Bloating, inability to lose weight   IMPRESSION:  Persistent abdominal bloating - evaluation and prior treatment trials outlined below Abnormal small bowel on CT 04/30/21     - ? 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 CSharlet Salinaas the trend and rising fT4  PLAN: - Pancreatic elastase - Continue Linzess 145 mcg daily (had stopped because it was expensive on prior insurance) - Trial of antispasmodic with dicyclomine '20mg'$  QID  - Consider neuromodulators if no improvement with the above plan including buspirone 10 mg TID, amitriptyline 50 mg daily, or escitalopram 10 mg daily - Review thyroid labs with Dr. CSharlet Salina- ? Could this be contributing given the recent escalation in symptoms - Surveillance colonoscopy 2027   Please see the "Patient Instructions" section for addition details about the plan.  HPI: Veronica Kraszewskiis a 57y.o. female who returns in follow-up for further evaluation of abdominal pain.  She was last seen 05/08/21. She is a nEngineer, maintenance (IT)by training.  She has hypothyroidism previously followed by Dr. 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  She reports stable thyroid labs.   She reports constant upper abdominal bloat in a band-like distribution and inability to lose weight.  No appetite with  early satiety. Finds bloating worsens over the course of a day with development of distension by the end of the day. Symptoms improve with defecation.  No relationship to eating or movement.  Denies constipation although she feels slightly better using Linzess daily.  There is a sense of incomplete evacuation.  She occasionally has urgency but does not pass a bowel movement. No association of symptoms with stress.  No blood or mucus in the stool.  No identified food triggers. She follows a vegetarian diet. No change with dairy or exposure to gluten. Sugar alcohols worsen her symptoms.  Trial of lowFODMAP diet was not helpful.   The consistency of her stools have changed. They are now sticky.    She thinks she may have IBS.    Prior evaluation: - Negative celiac serologies - Normal stool studies in CT - Colonoscopy in CT 03/27/2016 (results reviewed today) 638mtubular adenoma, fair/poor prep - Normal abdominal ultrasound 04/10/2021 - CT abd/pelvis with contrast 04/30/21: small liver cysts, borderline dilated loop in the small bowel with air-fluid levels without a lead point, scattered colonic diverticula - SIBO breath test ordered but not completed due to complexity of instructions - Normal evaluation by GYN including pelvic ultrasound - Normal liver enzymes with a low alk phos at 28 - Normal CBC with moderately elevated MCV at 106.9 - TSH low at 0.16 09/18/2022 within normal T4  Treatment trials include: - Trial of Xifaxan in CT in 2017 without change in symptoms - Multiple probiotics including Culturelle - Inno bowel cleanse resulted  in loose stools - Linzess 72 mcg daily may provide some relief, but, it's been incomplete - FDGuard - doesn't remember taking this or having any relief - Dicyclomine - may not have been helpful - LowFODMAP diet provided no symptomatic relief - Peppermint was not helpful  Family history of PUD. No known family history of colon cancer or polyps. No family  history of uterine/endometrial cancer, pancreatic cancer or gastric/stomach cancer.  Endoscopic history: - Colonoscopy 03/27/2016 in CT: 74m tubular adenoma, fair/poor prep - Colonoscopy 05/16/2021: melanosis coli, ascending colon hyperplastic polyp  Past Medical History:  Diagnosis Date   Hypothyroidism     Past Surgical History:  Procedure Laterality Date   BREAST BIOPSY Left 2017   benign   CESAREAN SECTION     2010   CHEST WALL BIOPSY     THYROID LOBECTOMY Right 08/17/2018   Procedure: RIGHT THYROID LOBECTOMY;  Surgeon: GArmandina Gemma MD;  Location: [facility];  Service: General;  Laterality: Right;    Current Outpatient Medications  Medication Sig Dispense Refill   Cholecalciferol (VITAMIN D) 50 MCG (2000 UT) CAPS Take 1 capsule by mouth daily.     Cyanocobalamin (VITAMIN B-12 PO) Take 1 each by mouth daily.     MAGNESIUM PO Take 1,000 mg by mouth at bedtime.     Omega-3 Fatty Acids (FISH OIL) 1000 MG CAPS Take 2,000 mg by mouth daily.     Probiotic Product (PROBIOTIC PO) Take 1 capsule by mouth daily.     SYNTHROID 175 MCG tablet Take 1 tablet (175 mcg total) by mouth daily. 90 tablet 3   valACYclovir (VALTREX) 1000 MG tablet Take 1,000 mg by mouth daily.      dicyclomine (BENTYL) 20 MG tablet Take 1 tablet (20 mg total) by mouth every 6 (six) hours. 120 tablet 2   linaclotide (LINZESS) 145 MCG CAPS capsule Take 1 capsule (145 mcg total) by mouth daily before breakfast. 30 capsule 1   No current facility-administered medications for this visit.    Allergies as of 10/28/2022 - Review Complete 10/28/2022  Allergen Reaction Noted   Penicillins Other (See Comments) 05/17/2018    Family History  Problem Relation Age of Onset   Breast cancer Mother 640  Colon cancer Maternal Aunt    Stomach cancer Paternal Aunt    Breast cancer Maternal Grandmother 615  Esophageal cancer Neg Hx    Pancreatic cancer Neg Hx    Rectal cancer Neg Hx      Physical Exam: General:   Alert,   well-nourished, pleasant and cooperative in NAD Weight 142 lb Weight 147 lb 03/27/21 Weight 143 lb 12/27/19 Weight 140 lb 12/06/19 Head:  Normocephalic and atraumatic. Eyes:  Sclera clear, no icterus.   Conjunctiva pink. Abdomen:  Soft, nontender, nondistended, normal bowel sounds, no rebound or guarding. No hepatosplenomegaly.   LAD: No inguinal or umbilical LAD Extremities:  No clubbing or edema. Neurologic:  Alert and  oriented x4;  grossly nonfocal Skin:  Intact without significant lesions or rashes. Psych:  Alert and cooperative. Normal mood and affect.     Lynda Wanninger L. BTarri Glenn MD, MPH 10/28/2022, 4:39 PM

## 2022-11-20 ENCOUNTER — Ambulatory Visit: Payer: 59

## 2022-11-24 ENCOUNTER — Telehealth: Payer: Self-pay | Admitting: Pharmacy Technician

## 2022-11-24 ENCOUNTER — Other Ambulatory Visit (HOSPITAL_COMMUNITY): Payer: Self-pay

## 2022-11-24 NOTE — Telephone Encounter (Signed)
Patient Advocate Encounter  Received notification from [employer] that prior authorization for [facility] 145MCG is required.   PA submitted on 2.6.24 Key [id number] Status is pending

## 2022-11-26 ENCOUNTER — Ambulatory Visit (INDEPENDENT_AMBULATORY_CARE_PROVIDER_SITE_OTHER): Payer: 59

## 2022-11-26 DIAGNOSIS — Z23 Encounter for immunization: Secondary | ICD-10-CM

## 2022-11-26 NOTE — Progress Notes (Signed)
After obtaining consent, and per orders of Dr. Sharlet Salina, injection of shingles  given by Marrian Salvage. Patient instructed to report any adverse reaction to me immediately.

## 2022-11-30 ENCOUNTER — Ambulatory Visit (INDEPENDENT_AMBULATORY_CARE_PROVIDER_SITE_OTHER): Payer: 59 | Admitting: Family Medicine

## 2022-11-30 ENCOUNTER — Ambulatory Visit (INDEPENDENT_AMBULATORY_CARE_PROVIDER_SITE_OTHER): Payer: 59

## 2022-11-30 ENCOUNTER — Encounter: Payer: Self-pay | Admitting: Family Medicine

## 2022-11-30 VITALS — BP 132/80 | HR 72 | Temp 98.0°F | Resp 20 | Ht 62.0 in | Wt 144.0 lb

## 2022-11-30 DIAGNOSIS — W19XXXA Unspecified fall, initial encounter: Secondary | ICD-10-CM

## 2022-11-30 DIAGNOSIS — M79641 Pain in right hand: Secondary | ICD-10-CM

## 2022-11-30 NOTE — Progress Notes (Signed)
Assessment & Plan:  1-2. Right hand pain/Fall, initial encounter X-ray today. Encouraged elevation, rest, and Ibuprofen. - DG Hand Complete Right; Future   Follow up plan: Return if symptoms worsen or fail to improve.  Hendricks Limes, MSN, APRN, FNP-C  Subjective:  HPI: Veronica Schwartz is a 57 y.o. female presenting on 11/30/2022 for right hand pain (Fall this weekend - bruising and swelling )  Patient complaints of right hand pain. This is evaluated as a personal injury. The pain began 2 days ago after a fall where she tripped and caught herself with her right hand. The pain is located primarily in the right thenar eminence.  She describes the symptoms as aching and feeling bruised. No home treatments.    ROS: Negative unless specifically indicated above in HPI.   Relevant past medical history reviewed and updated as indicated.   Allergies and medications reviewed and updated.   Current Outpatient Medications:    Cholecalciferol (VITAMIN D) 50 MCG (2000 UT) CAPS, Take 1 capsule by mouth daily., Disp: , Rfl:    Cyanocobalamin (VITAMIN B-12 PO), Take 1 each by mouth daily., Disp: , Rfl:    dicyclomine (BENTYL) 20 MG tablet, Take 1 tablet (20 mg total) by mouth every 6 (six) hours., Disp: 120 tablet, Rfl: 2   MAGNESIUM PO, Take 1,000 mg by mouth at bedtime., Disp: , Rfl:    Omega-3 Fatty Acids (FISH OIL) 1000 MG CAPS, Take 2,000 mg by mouth daily., Disp: , Rfl:    Probiotic Product (PROBIOTIC PO), Take 1 capsule by mouth daily., Disp: , Rfl:    SYNTHROID 175 MCG tablet, Take 1 tablet (175 mcg total) by mouth daily., Disp: 90 tablet, Rfl: 3   valACYclovir (VALTREX) 1000 MG tablet, Take 1,000 mg by mouth daily. , Disp: , Rfl:    linaclotide (LINZESS) 145 MCG CAPS capsule, Take 1 capsule (145 mcg total) by mouth daily before breakfast. (Patient not taking: Reported on 11/30/2022), Disp: 30 capsule, Rfl: 1  Allergies  Allergen Reactions   Penicillins Other (See Comments)    Fever  convulsion Has patient had a PCN reaction causing immediate rash, facial/tongue/throat swelling, SOB or lightheadedness with hypotension: Unknown Has patient had a PCN reaction causing severe rash involving mucus membranes or skin necrosis: Unknown Has patient had a PCN reaction that required hospitalization: Unknown Has patient had a PCN reaction occurring within the last 10 years: No If all of the above answers are "NO", then may proceed with Cephalosporin use.     Objective:   BP 132/80   Pulse 72   Temp 98 F (36.7 C)   Resp 20   Ht 5' 2"$  (1.575 m)   Wt 144 lb (65.3 kg)   LMP 07/15/2020   BMI 26.34 kg/m    Physical Exam Vitals reviewed.  Constitutional:      General: She is not in acute distress.    Appearance: Normal appearance. She is not ill-appearing, toxic-appearing or diaphoretic.  HENT:     Head: Normocephalic and atraumatic.  Eyes:     General: No scleral icterus.       Right eye: No discharge.        Left eye: No discharge.     Conjunctiva/sclera: Conjunctivae normal.  Cardiovascular:     Rate and Rhythm: Normal rate.  Pulmonary:     Effort: Pulmonary effort is normal. No respiratory distress.  Musculoskeletal:        General: Normal range of motion.     Right  hand: Swelling (dorsal aspect) and tenderness (generazlied around thumb) present. No deformity, lacerations or bony tenderness. Normal range of motion. Normal strength. Normal sensation. Normal capillary refill. Normal pulse.     Cervical back: Normal range of motion.  Skin:    General: Skin is warm and dry.     Capillary Refill: Capillary refill takes less than 2 seconds.     Findings: Bruising (dorsal aspect of right hand with the majority in the right thenar eminence) present.  Neurological:     General: No focal deficit present.     Mental Status: She is alert and oriented to person, place, and time. Mental status is at baseline.  Psychiatric:        Mood and Affect: Mood normal.         Behavior: Behavior normal.        Thought Content: Thought content normal.        Judgment: Judgment normal.

## 2023-02-09 NOTE — Telephone Encounter (Signed)
PA has been APPROVED from 11/24/2022-11/25/2023 

## 2023-03-24 ENCOUNTER — Encounter: Payer: Self-pay | Admitting: Internal Medicine

## 2023-03-24 ENCOUNTER — Ambulatory Visit (INDEPENDENT_AMBULATORY_CARE_PROVIDER_SITE_OTHER): Payer: 59 | Admitting: Internal Medicine

## 2023-03-24 VITALS — BP 120/84 | HR 76 | Temp 99.2°F | Ht 62.0 in | Wt 147.0 lb

## 2023-03-24 DIAGNOSIS — E063 Autoimmune thyroiditis: Secondary | ICD-10-CM | POA: Diagnosis not present

## 2023-03-24 DIAGNOSIS — R635 Abnormal weight gain: Secondary | ICD-10-CM | POA: Diagnosis not present

## 2023-03-24 DIAGNOSIS — E038 Other specified hypothyroidism: Secondary | ICD-10-CM | POA: Diagnosis not present

## 2023-03-24 LAB — COMPREHENSIVE METABOLIC PANEL
ALT: 19 U/L (ref 0–35)
AST: 27 U/L (ref 0–37)
Albumin: 4.4 g/dL (ref 3.5–5.2)
Alkaline Phosphatase: 37 U/L — ABNORMAL LOW (ref 39–117)
BUN: 9 mg/dL (ref 6–23)
CO2: 25 mEq/L (ref 19–32)
Calcium: 9.4 mg/dL (ref 8.4–10.5)
Chloride: 105 mEq/L (ref 96–112)
Creatinine, Ser: 0.77 mg/dL (ref 0.40–1.20)
GFR: 86.02 mL/min (ref >60.00)
Glucose, Bld: 83 mg/dL (ref 70–99)
Potassium: 4.7 mEq/L (ref 3.5–5.1)
Sodium: 141 mEq/L (ref 135–145)
Total Bilirubin: 0.4 mg/dL (ref 0.2–1.2)
Total Protein: 7.1 g/dL (ref 6.0–8.3)

## 2023-03-24 LAB — CBC
HCT: 42.4 % (ref 36.0–46.0)
Hemoglobin: 14.2 g/dL (ref 12.0–15.0)
MCHC: 33.4 g/dL (ref 30.0–36.0)
MCV: 105.1 fl — ABNORMAL HIGH (ref 78.0–100.0)
Platelets: 339 10*3/uL (ref 150.0–400.0)
RBC: 4.04 Mil/uL (ref 3.87–5.11)
RDW: 12.2 % (ref 11.5–15.5)
WBC: 3.9 10*3/uL — ABNORMAL LOW (ref 4.0–10.5)

## 2023-03-24 LAB — VITAMIN D 25 HYDROXY (VIT D DEFICIENCY, FRACTURES): VITD: 52.35 ng/mL (ref 30.00–100.00)

## 2023-03-24 LAB — T4, FREE: Free T4: 1.1 ng/dL (ref 0.60–1.60)

## 2023-03-24 LAB — TSH: TSH: 3.37 u[IU]/mL (ref 0.35–5.50)

## 2023-03-24 LAB — VITAMIN B12: Vitamin B-12: 871 pg/mL (ref 211–911)

## 2023-03-24 NOTE — Progress Notes (Signed)
   Subjective:   Patient ID: Veronica Schwartz, female    DOB: Nov 04, 1965, 57 y.o.   MRN: [id number]  HPI The patient is a 57 YO female coming in for fatigue and weight gain. Unclear if thyroid levels are off. Missed period last month but has period this month.  Review of Systems  Constitutional:  Positive for fatigue and unexpected weight change.  HENT: Negative.    Eyes: Negative.   Respiratory:  Negative for cough, chest tightness and shortness of breath.   Cardiovascular:  Negative for chest pain, palpitations and leg swelling.  Gastrointestinal:  Negative for abdominal distention, abdominal pain, constipation, diarrhea, nausea and vomiting.  Musculoskeletal: Negative.   Skin: Negative.   Neurological: Negative.   Psychiatric/Behavioral: Negative.      Objective:  Physical Exam Constitutional:      Appearance: She is well-developed.  HENT:     Head: Normocephalic and atraumatic.  Cardiovascular:     Rate and Rhythm: Normal rate and regular rhythm.  Pulmonary:     Effort: Pulmonary effort is normal. No respiratory distress.     Breath sounds: Normal breath sounds. No wheezing or rales.  Abdominal:     General: Bowel sounds are normal. There is no distension.     Palpations: Abdomen is soft.     Tenderness: There is no abdominal tenderness. There is no rebound.  Musculoskeletal:     Cervical back: Normal range of motion.  Skin:    General: Skin is warm and dry.  Neurological:     Mental Status: She is alert and oriented to person, place, and time.     Coordination: Coordination normal.     Vitals:   03/24/23 0912  BP: 120/84  Pulse: 76  Temp: 99.2 F (37.3 C)  TempSrc: Oral  SpO2: 95%  Weight: 147 lb (66.7 kg)  Height: 5\' 2"  (1.575 m)    Assessment & Plan:

## 2023-03-24 NOTE — Assessment & Plan Note (Signed)
Checking TSH and free t4 and adjust synthroid 175 mcg daily as needed.

## 2023-03-24 NOTE — Assessment & Plan Note (Signed)
Checking vitamin D, B12, CMP, CBC, TSH, free T4 and adjust as needed. She is struggling with weight gain and is likely perimenopausal which could be the cause.

## 2023-04-02 ENCOUNTER — Other Ambulatory Visit: Payer: Self-pay | Admitting: Endocrinology

## 2023-04-02 DIAGNOSIS — E063 Autoimmune thyroiditis: Secondary | ICD-10-CM

## 2023-04-02 DIAGNOSIS — E038 Other specified hypothyroidism: Secondary | ICD-10-CM

## 2023-05-23 ENCOUNTER — Other Ambulatory Visit: Payer: Self-pay | Admitting: Endocrinology

## 2023-05-23 DIAGNOSIS — E038 Other specified hypothyroidism: Secondary | ICD-10-CM

## 2023-08-27 ENCOUNTER — Encounter: Payer: Self-pay | Admitting: Internal Medicine

## 2023-08-27 DIAGNOSIS — E063 Autoimmune thyroiditis: Secondary | ICD-10-CM

## 2023-08-27 NOTE — Telephone Encounter (Signed)
They are requesting Labs and EKG for patient

## 2023-08-30 ENCOUNTER — Encounter: Payer: Self-pay | Admitting: Internal Medicine

## 2023-08-30 ENCOUNTER — Ambulatory Visit (INDEPENDENT_AMBULATORY_CARE_PROVIDER_SITE_OTHER): Payer: 59 | Admitting: Internal Medicine

## 2023-08-30 VITALS — BP 133/93 | HR 75 | Temp 98.3°F | Ht 62.0 in | Wt 147.0 lb

## 2023-08-30 DIAGNOSIS — Z01810 Encounter for preprocedural cardiovascular examination: Secondary | ICD-10-CM | POA: Insufficient documentation

## 2023-08-30 DIAGNOSIS — E063 Autoimmune thyroiditis: Secondary | ICD-10-CM

## 2023-08-30 DIAGNOSIS — Z0001 Encounter for general adult medical examination with abnormal findings: Secondary | ICD-10-CM

## 2023-08-30 LAB — CBC WITH DIFFERENTIAL/PLATELET
Basophils Absolute: 0.1 10*3/uL (ref 0.0–0.1)
Basophils Relative: 1.3 % (ref 0.0–3.0)
Eosinophils Absolute: 0.1 10*3/uL (ref 0.0–0.7)
Eosinophils Relative: 0.9 % (ref 0.0–5.0)
HCT: 43.4 % (ref 36.0–46.0)
Hemoglobin: 15.1 g/dL — ABNORMAL HIGH (ref 12.0–15.0)
Lymphocytes Relative: 25.1 % (ref 12.0–46.0)
Lymphs Abs: 1.7 10*3/uL (ref 0.7–4.0)
MCHC: 34.7 g/dL (ref 30.0–36.0)
MCV: 109.1 fL — ABNORMAL HIGH (ref 78.0–100.0)
Monocytes Absolute: 0.6 10*3/uL (ref 0.1–1.0)
Monocytes Relative: 9 % (ref 3.0–12.0)
Neutro Abs: 4.4 10*3/uL (ref 1.4–7.7)
Neutrophils Relative %: 63.7 % (ref 43.0–77.0)
Platelets: 379 10*3/uL (ref 150.0–400.0)
RBC: 3.98 Mil/uL (ref 3.87–5.11)
RDW: 13 % (ref 11.5–15.5)
WBC: 7 10*3/uL (ref 4.0–10.5)

## 2023-08-30 LAB — APTT: aPTT: 25.3 s — ABNORMAL LOW (ref 25.4–36.8)

## 2023-08-30 LAB — COMPREHENSIVE METABOLIC PANEL
ALT: 19 U/L (ref 0–35)
AST: 27 U/L (ref 0–37)
Albumin: 4.5 g/dL (ref 3.5–5.2)
Alkaline Phosphatase: 42 U/L (ref 39–117)
BUN: 14 mg/dL (ref 6–23)
CO2: 30 meq/L (ref 19–32)
Calcium: 9.9 mg/dL (ref 8.4–10.5)
Chloride: 102 meq/L (ref 96–112)
Creatinine, Ser: 0.75 mg/dL (ref 0.40–1.20)
GFR: 88.51 mL/min (ref >60.00)
Glucose, Bld: 101 mg/dL — ABNORMAL HIGH (ref 70–99)
Potassium: 5.1 meq/L (ref 3.5–5.1)
Sodium: 141 meq/L (ref 135–145)
Total Bilirubin: 0.8 mg/dL (ref 0.2–1.2)
Total Protein: 7.2 g/dL (ref 6.0–8.3)

## 2023-08-30 LAB — LIPID PANEL
Cholesterol: 244 mg/dL — ABNORMAL HIGH (ref 0–200)
HDL: 86.9 mg/dL (ref >39.00)
LDL Cholesterol: 131 mg/dL — ABNORMAL HIGH (ref 0–99)
NonHDL: 157.32
Total CHOL/HDL Ratio: 3
Triglycerides: 134 mg/dL (ref 0.0–149.0)
VLDL: 26.8 mg/dL (ref 0.0–40.0)

## 2023-08-30 LAB — PROTIME-INR
INR: 1 {ratio} (ref 0.8–1.0)
Prothrombin Time: 10.6 s (ref 9.6–13.1)

## 2023-08-30 LAB — HEMOGLOBIN A1C: Hgb A1c MFr Bld: 5.3 % (ref 4.6–6.5)

## 2023-08-30 LAB — TSH: TSH: 0.45 u[IU]/mL (ref 0.35–5.50)

## 2023-08-30 LAB — T4, FREE: Free T4: 0.87 ng/dL (ref 0.60–1.60)

## 2023-08-30 MED ORDER — SYNTHROID 175 MCG PO TABS
175.0000 ug | ORAL_TABLET | Freq: Every day | ORAL | 0 refills | Status: DC
Start: 2023-08-30 — End: 2023-11-16

## 2023-08-30 NOTE — Patient Instructions (Signed)
We will get you the form for the surgeon to take to them and the EKG.

## 2023-08-30 NOTE — Assessment & Plan Note (Signed)
EKG done and stable from prior. Checking labs per surgeon's request. Medically stable and no further testing required.

## 2023-08-30 NOTE — Progress Notes (Signed)
   Subjective:   Patient ID: Veronica Schwartz, female    DOB: [birth year], 57 y.o.   MRN: [id number]  HPI The patient is a 57 YO female coming in for pre-op assessment. Considering surgery with 5 hour anaesthesia time. No chest pain or SOB. Still not smoking.  Review of Systems  Constitutional: Negative.   HENT: Negative.    Eyes: Negative.   Respiratory:  Negative for cough, chest tightness and shortness of breath.   Cardiovascular:  Negative for chest pain, palpitations and leg swelling.  Gastrointestinal:  Negative for abdominal distention, abdominal pain, constipation, diarrhea, nausea and vomiting.  Musculoskeletal: Negative.   Skin: Negative.   Neurological: Negative.   Psychiatric/Behavioral: Negative.      Objective:  Physical Exam Constitutional:      Appearance: She is well-developed.  HENT:     Head: Normocephalic and atraumatic.  Cardiovascular:     Rate and Rhythm: Normal rate and regular rhythm.  Pulmonary:     Effort: Pulmonary effort is normal. No respiratory distress.     Breath sounds: Normal breath sounds. No wheezing or rales.  Abdominal:     General: Bowel sounds are normal. There is no distension.     Palpations: Abdomen is soft.     Tenderness: There is no abdominal tenderness. There is no rebound.  Musculoskeletal:     Cervical back: Normal range of motion.  Skin:    General: Skin is warm and dry.  Neurological:     Mental Status: She is alert and oriented to person, place, and time.     Coordination: Coordination normal.     Vitals:   08/30/23 1306 08/30/23 1314  BP: (!) 133/93 (!) 133/93  Pulse: 75   Temp: 98.3 F (36.8 C)   TempSrc: Oral   SpO2: 97%   Weight: 147 lb (66.7 kg)   Height: 5\' 2"  (1.575 m)    EKG: Rate 68, axis normal, interval normal, sinus, no st or t wave changes read mentions possible LAE provider interpretation is no LAE signs, no significant change compared to prior 2021  Visit time 20 minutes in face to face  communication with patient and coordination of care, additional 10 minutes spent in record review, coordination or care, ordering tests, communicating/referring to other healthcare professionals, documenting in medical records all on the same day of the visit for total time 30 minutes spent on the visit.   Assessment & Plan:

## 2023-08-30 NOTE — Assessment & Plan Note (Signed)
Checking TSH and free T4 and adjust synthroid 175 mcg daily as needed.  

## 2023-11-16 MED ORDER — SYNTHROID 150 MCG PO TABS
150.0000 ug | ORAL_TABLET | Freq: Every day | ORAL | 0 refills | Status: DC
Start: 2023-11-16 — End: 2024-03-15

## 2023-11-16 NOTE — Addendum Note (Signed)
Addended by: Hillard Danker A on: 11/16/2023 11:33 AM   Modules accepted: Orders

## 2024-03-12 ENCOUNTER — Telehealth: Payer: Self-pay | Admitting: Internal Medicine

## 2024-03-15 ENCOUNTER — Other Ambulatory Visit: Payer: Self-pay

## 2024-03-15 NOTE — Telephone Encounter (Signed)
 Sent in

## 2024-03-15 NOTE — Telephone Encounter (Signed)
 Copied from CRM [id number]. Topic: Clinical - Medication Question >> Mar 15, 2024  8:59 AM Adonis Hoot wrote: Reason for CRM: Patient called in stating that she has been out of SYNTHROID  150 MCG tablet medication for a few days,and would like to know if provider would be able to fill for her today?

## 2024-04-21 ENCOUNTER — Emergency Department (HOSPITAL_BASED_OUTPATIENT_CLINIC_OR_DEPARTMENT_OTHER)
Admission: EM | Admit: 2024-04-21 | Discharge: 2024-04-21 | Disposition: A | Discharge status: Home / Self Care | Attending: Emergency Medicine | Admitting: Emergency Medicine

## 2024-04-21 ENCOUNTER — Other Ambulatory Visit: Payer: Self-pay

## 2024-04-21 ENCOUNTER — Encounter (HOSPITAL_BASED_OUTPATIENT_CLINIC_OR_DEPARTMENT_OTHER): Payer: Self-pay

## 2024-04-21 DIAGNOSIS — E039 Hypothyroidism, unspecified: Secondary | ICD-10-CM | POA: Diagnosis not present

## 2024-04-21 DIAGNOSIS — Z79899 Other long term (current) drug therapy: Secondary | ICD-10-CM | POA: Diagnosis not present

## 2024-04-21 DIAGNOSIS — W540XXA Bitten by dog, initial encounter: Secondary | ICD-10-CM | POA: Insufficient documentation

## 2024-04-21 DIAGNOSIS — Y93K9 Activity, other involving animal care: Secondary | ICD-10-CM | POA: Diagnosis not present

## 2024-04-21 DIAGNOSIS — S0991XA Unspecified injury of ear, initial encounter: Secondary | ICD-10-CM | POA: Diagnosis present

## 2024-04-21 DIAGNOSIS — S01311A Laceration without foreign body of right ear, initial encounter: Secondary | ICD-10-CM | POA: Insufficient documentation

## 2024-04-21 MED ORDER — CEFADROXIL 500 MG PO CAPS: 500.0000 mg | ORAL_CAPSULE | Freq: Two times a day (BID) | ORAL | 0 refills | Status: AC

## 2024-04-21 MED ORDER — LIDOCAINE HCL (PF) 1 % IJ SOLN
10.0000 mL | Freq: Once | INTRAMUSCULAR | Status: AC
  Administered 2024-04-21: 10 mL
  Filled 2024-04-21: qty 10

## 2024-04-21 NOTE — ED Notes (Signed)
 Fall risk bundle is currently in place.

## 2024-04-21 NOTE — Discharge Instructions (Addendum)
 As discussed, will put you on antibiotics to prevent infection.  Wash area gently with warm soapy water.  Recommend follow-up with plastic surgery for reevaluation if you would like more cosmetic procedure performed on your right earlobe.

## 2024-04-21 NOTE — ED Triage Notes (Signed)
 Pt states she was bit by an vaccinated dog around an hour ago on her ear. Pt has part of her ear lobe in a plastic bag with ice. Bleeding controlled at time of triage.

## 2024-04-21 NOTE — ED Provider Notes (Signed)
 Learned EMERGENCY DEPARTMENT AT [facility] Provider Note   CSN: [id number] Arrival date & time: 04/21/24  2138     Patient presents with: Animal Bite   Jenessa Weiss is a 58 y.o. female.    Animal Bite   58 year old female presents emergency department with complaints of ear laceration.  Patient states that she was petting one of her friend's dogs when she got too close and it bit her right earlobe.  States that part of it is in a bag on the table in the room.  Denies trauma/pain elsewhere.  States that she is up-to-date on her Tdap.  States the dog was vaccinated.  Presents for repair of laceration.  Past medical history significant for hypothyroidism, ovarian cyst  Prior to Admission medications   Medication Sig Start Date End Date Taking? Authorizing Provider  Cholecalciferol (VITAMIN D ) 50 MCG (2000 UT) CAPS Take 1 capsule by mouth daily.    [provider]  Cyanocobalamin  (VITAMIN B-12 PO) Take 1 each by mouth daily.    [provider]  dicyclomine  (BENTYL ) 20 MG tablet Take 1 tablet (20 mg total) by mouth every 6 (six) hours. Patient not taking: Reported on 08/30/2023 10/28/22   Eda Iha, MD  MAGNESIUM PO Take 1,000 mg by mouth at bedtime.    [provider]  Omega-3 Fatty Acids (FISH OIL) 1000 MG CAPS Take 2,000 mg by mouth daily.    [provider]  Probiotic Product (PROBIOTIC PO) Take 1 capsule by mouth daily.    [provider]  SYNTHROID  150 MCG tablet TAKE 1 TABLET BY MOUTH DAILY BEFORE BREAKFAST. 03/15/24   Rollene Almarie LABOR, MD  valACYclovir (VALTREX) 1000 MG tablet Take 1,000 mg by mouth daily.  04/28/18   [provider]    Allergies: Penicillins    Review of Systems  All other systems reviewed and are negative.   Updated Vital Signs BP (!) 134/98 (BP Location: Right Arm)   Pulse 81   Temp 98.1 F (36.7 C)   Resp 16   Ht 5' 2 (1.575 m)   Wt 63.5 kg   LMP 07/20/2023   SpO2 100%    BMI 25.61 kg/m   Physical Exam Vitals and nursing note reviewed.  Constitutional:      General: She is not in acute distress.    Appearance: She is well-developed.  HENT:     Head: Normocephalic.     Ears:     Comments: Patient with inferior aspect of earlobe avulsed and bag at bedside.  Stellate type laceration appreciated involving remaining earlobe tracking up distal third of ear posteriorly.  Laceration measuring 4.0 cm in length..  No other trauma to face or ear. Eyes:     Conjunctiva/sclera: Conjunctivae normal.  Cardiovascular:     Rate and Rhythm: Normal rate and regular rhythm.     Heart sounds: No murmur heard. Pulmonary:     Effort: Pulmonary effort is normal. No respiratory distress.     Breath sounds: Normal breath sounds.  Abdominal:     Palpations: Abdomen is soft.     Tenderness: There is no abdominal tenderness.  Musculoskeletal:        General: No swelling.     Cervical back: Neck supple.  Skin:    General: Skin is warm and dry.     Capillary Refill: Capillary refill takes less than 2 seconds.  Neurological:     Mental Status: She is alert.  Psychiatric:  Mood and Affect: Mood normal.     (all labs ordered are listed, but only abnormal results are displayed) Labs Reviewed - No data to display  EKG: None  Radiology: No results found.   .Laceration Repair  Date/Time: 04/21/2024 11:34 PM  Performed by: Silver Wonda LABOR, PA Authorized by: Silver Wonda LABOR, PA   Consent:    Consent obtained:  Verbal   Consent given by:  Patient   Risks, benefits, and alternatives were discussed: yes     Risks discussed:  Infection, need for additional repair, nerve damage, poor wound healing, pain, poor cosmetic result and retained foreign body   Alternatives discussed:  No treatment and delayed treatment Universal protocol:    Procedure explained and questions answered to patient or proxy's satisfaction: yes     Test results available: yes      Patient identity confirmed:  Verbally with patient Anesthesia:    Anesthesia method:  Nerve block   Block needle gauge:  25 G   Block anesthetic:  Lidocaine  1% w/o epi   Block technique:  Inferior auricular block   Block injection procedure:  Anatomic landmarks identified, anatomic landmarks palpated, negative aspiration for blood, introduced needle and incremental injection   Block outcome:  Anesthesia achieved Laceration details:    Location:  Ear   Ear location:  R ear   Length (cm):  4 Pre-procedure details:    Preparation:  Patient was prepped and draped in usual sterile fashion Exploration:    Limited defect created (wound extended): no     Hemostasis achieved with:  Direct pressure   Imaging outcome: foreign body not noted     Wound exploration: wound explored through full range of motion and entire depth of wound visualized     Contaminated: no   Treatment:    Area cleansed with:  Chlorhexidine  and saline   Amount of cleaning:  Standard   Irrigation solution:  Sterile water   Irrigation volume:  500cc   Irrigation method:  Syringe   Visualized foreign bodies/material removed: no     Debridement:  None   Undermining:  None   Scar revision: no   Skin repair:    Repair method:  Sutures   Suture size:  5-0   Suture material:  Fast-absorbing gut   Suture technique:  Simple interrupted   Number of sutures:  12 Approximation:    Approximation:  Close Repair type:    Repair type:  Complex Post-procedure details:    Dressing:  Open (no dressing)   Procedure completion:  Tolerated well, no immediate complications    Medications Ordered in the ED  lidocaine  (PF) (XYLOCAINE ) 1 % injection 10 mL (10 mLs Infiltration Given 04/21/24 2221)                                    Medical Decision Making Risk Prescription drug management.   This patient presents to the ED for concern of your laceration, this involves an extensive number of treatment options, and is a complaint  that carries with it a high risk of complications and morbidity.  The differential diagnosis includes ear laceration, foreign body retainment, other   Co morbidities that complicate the patient evaluation  See HPI   Additional history obtained:  Additional history obtained from EMR External records from outside source obtained and reviewed including hospital records   Lab Tests:  N/a   Imaging Studies  ordered:  N/a   Cardiac Monitoring: / EKG:  N/a   Consultations Obtained:  N/a   Problem List / ED Course / Critical interventions / Medication management  Ear laceration I ordered medication including lidocaine     Reevaluation of the patient after these medicines showed that the patient improved I have reviewed the patients home medicines and have made adjustments as needed   Social Determinants of Health:  Former cigarette use.  Denies illicit drug use.   Test / Admission - Considered:  Ear laceration Vitals signs within normal range and stable throughout visit. 58 year old female presents emergency department with complaints of ear laceration.  Patient states that she was petting one of her friend's dogs when she got too close and it bit her right earlobe.  States that part of it is in a bag on the table in the room.  Denies trauma/pain elsewhere.  States that she is up-to-date on her Tdap.  States the dog was vaccinated.  Presents for repair of laceration. On exam, avulsion inferior aspect of patient's right earlobe with stellate type laceration remaining.  Area cleansed, incised and repaired in manner as above.  Will recommend local wound care at home.  Will place patient on antibiotics empirically given dog bite.  Would recommend follow-up with primary care/plastic surgery in the outpatient setting for reassessment.  Treatment plan discussed with patient and she acknowledged understanding was agreeable to said plan.  Patient overall well-appearing, afebrile in  no acute distress. Worrisome signs and symptoms were discussed with the patient, and the patient acknowledged understanding to return to the ED if noticed. Patient was stable upon discharge.       Final diagnoses:  None    ED Discharge Orders     None          Silver Wonda LABOR, [place] 04/21/24 2336    Geraldene Hamilton, MD 04/23/24 2223

## 2024-04-25 ENCOUNTER — Encounter: Payer: Self-pay | Admitting: Internal Medicine

## 2024-04-25 ENCOUNTER — Ambulatory Visit: Admitting: Internal Medicine

## 2024-04-25 VITALS — BP 124/74 | HR 86 | Temp 98.9°F | Ht 62.0 in | Wt 140.0 lb

## 2024-04-25 DIAGNOSIS — Z8249 Family history of ischemic heart disease and other diseases of the circulatory system: Secondary | ICD-10-CM

## 2024-04-25 DIAGNOSIS — E063 Autoimmune thyroiditis: Secondary | ICD-10-CM

## 2024-04-25 DIAGNOSIS — Z0000 Encounter for general adult medical examination without abnormal findings: Secondary | ICD-10-CM | POA: Diagnosis not present

## 2024-04-25 DIAGNOSIS — Z0001 Encounter for general adult medical examination with abnormal findings: Secondary | ICD-10-CM

## 2024-04-25 LAB — CBC
HCT: 45.4 % (ref 36.0–46.0)
Hemoglobin: 15.5 g/dL — ABNORMAL HIGH (ref 12.0–15.0)
MCHC: 34.2 g/dL (ref 30.0–36.0)
MCV: 107.1 fl — ABNORMAL HIGH (ref 78.0–100.0)
Platelets: 308 K/uL (ref 150.0–400.0)
RBC: 4.24 Mil/uL (ref 3.87–5.11)
RDW: 13.4 % (ref 11.5–15.5)
WBC: 7.1 K/uL (ref 4.0–10.5)

## 2024-04-25 LAB — COMPREHENSIVE METABOLIC PANEL WITH GFR
ALT: 17 U/L (ref 0–35)
AST: 27 U/L (ref 0–37)
Albumin: 4.7 g/dL (ref 3.5–5.2)
Alkaline Phosphatase: 43 U/L (ref 39–117)
BUN: 13 mg/dL (ref 6–23)
CO2: 30 meq/L (ref 19–32)
Calcium: 9.9 mg/dL (ref 8.4–10.5)
Chloride: 99 meq/L (ref 96–112)
Creatinine, Ser: 0.73 mg/dL (ref 0.40–1.20)
GFR: 91.01 mL/min (ref >60.00)
Glucose, Bld: 130 mg/dL — ABNORMAL HIGH (ref 70–99)
Potassium: 4.1 meq/L (ref 3.5–5.1)
Sodium: 138 meq/L (ref 135–145)
Total Bilirubin: 0.7 mg/dL (ref 0.2–1.2)
Total Protein: 7.3 g/dL (ref 6.0–8.3)

## 2024-04-25 LAB — TSH: TSH: 0.82 u[IU]/mL (ref 0.35–5.50)

## 2024-04-25 LAB — LIPID PANEL
Cholesterol: 233 mg/dL — ABNORMAL HIGH (ref 0–200)
HDL: 110.2 mg/dL (ref >39.00)
LDL Cholesterol: 108 mg/dL — ABNORMAL HIGH (ref 0–99)
NonHDL: 122.79
Total CHOL/HDL Ratio: 2
Triglycerides: 73 mg/dL (ref 0.0–149.0)
VLDL: 14.6 mg/dL (ref 0.0–40.0)

## 2024-04-25 LAB — T4, FREE: Free T4: 1.09 ng/dL (ref 0.60–1.60)

## 2024-04-25 NOTE — Progress Notes (Unsigned)
   Subjective:   Patient ID: Veronica Schwartz, female    DOB: 12/29/1965, 58 y.o.   MRN: [id number]  HPI The patient is here for physical.  PMH, [facility], social history reviewed and updated  Review of Systems  Objective:  Physical Exam  Vitals:   04/25/24 1503  BP: 124/74  Pulse: 86  Temp: 98.9 F (37.2 C)  TempSrc: Oral  SpO2: 98%  Weight: 140 lb (63.5 kg)  Height: 5' 2 (1.575 m)    Assessment & Plan:

## 2024-04-27 NOTE — Assessment & Plan Note (Signed)
 Flu shot yearly. Shingrix  complete. Tetanus up to date. Colonoscopy up to date. Mammogram up to date with gyn, pap smear getting soon with gyn. Counseled about sun safety and mole surveillance. Counseled about the dangers of distracted driving. Given 10 year screening recommendations.

## 2024-04-27 NOTE — Assessment & Plan Note (Signed)
 Checking lipid panel and CMP and CBC. Adjust as needed.

## 2024-04-27 NOTE — Assessment & Plan Note (Signed)
 Checking TSH and free T4 and adjust synthroid  as needed. She is having more fatigue may need dose adjustment.

## 2024-05-01 ENCOUNTER — Ambulatory Visit: Payer: Self-pay | Admitting: Internal Medicine

## 2024-05-02 ENCOUNTER — Encounter: Payer: Self-pay | Admitting: Internal Medicine

## 2024-05-02 ENCOUNTER — Ambulatory Visit (INDEPENDENT_AMBULATORY_CARE_PROVIDER_SITE_OTHER): Admitting: Internal Medicine

## 2024-05-02 VITALS — BP 122/80 | Ht 62.0 in | Wt 140.0 lb

## 2024-05-02 DIAGNOSIS — H9201 Otalgia, right ear: Secondary | ICD-10-CM | POA: Insufficient documentation

## 2024-05-02 MED ORDER — LEVOTHYROXINE SODIUM 150 MCG PO TABS: 150.0000 ug | ORAL_TABLET | Freq: Every day | ORAL | 3 refills | Status: AC

## 2024-05-02 NOTE — Assessment & Plan Note (Signed)
 Sutures removed and wound inspected. Healing appropriately but it is unsure if cosmetic result will be acceptable to patient. She had dog bite injury with removal of earlobe partially. This is healing and attached but she will see plastic surgeon in near future to assess need for cosmetic alteration to achieve more natural result. She is concerned about ability to have earring down the line.

## 2024-05-02 NOTE — Progress Notes (Signed)
   Subjective:   Patient ID: Veronica Schwartz, female    DOB: 02/17/1966, 58 y.o.   MRN: [id number]  HPI The patient is a 58 YO female coming in for suture removal and wound healing check on her ear. S/P dog bite part of the earlobe torn off. Concern for need for plastic surgery.   Review of Systems  Constitutional: Negative.   HENT: Negative.    Eyes: Negative.   Respiratory:  Negative for cough, chest tightness and shortness of breath.   Cardiovascular:  Negative for chest pain, palpitations and leg swelling.  Gastrointestinal:  Negative for abdominal distention, abdominal pain, constipation, diarrhea, nausea and vomiting.  Musculoskeletal: Negative.   Skin:  Positive for wound.  Neurological: Negative.   Psychiatric/Behavioral: Negative.      Objective:  Physical Exam Constitutional:      Appearance: She is well-developed.  HENT:     Head: Normocephalic and atraumatic.  Cardiovascular:     Rate and Rhythm: Normal rate and regular rhythm.  Pulmonary:     Effort: Pulmonary effort is normal. No respiratory distress.     Breath sounds: Normal breath sounds. No wheezing or rales.  Abdominal:     General: Bowel sounds are normal. There is no distension.     Palpations: Abdomen is soft.     Tenderness: There is no abdominal tenderness. There is no rebound.  Musculoskeletal:     Cervical back: Normal range of motion.  Skin:    General: Skin is warm and dry.     Comments: Right earlobe sutures removed and some scabbing removed. Tolerated well and no signs of infection. Healing appropriately.   Neurological:     Mental Status: She is alert and oriented to person, place, and time.     Coordination: Coordination normal.     Vitals:   05/02/24 0812  BP: 122/80  TempSrc: Oral  Weight: 140 lb (63.5 kg)  Height: 5' 2 (1.575 m)    Assessment & Plan:  Visit time 10 minutes in face to face communication with patient and coordination of care, additional 10 minutes spent in record  review, coordination or care, ordering tests, communicating/referring to other healthcare professionals, documenting in medical records all on the same day of the visit for total time 20 minutes spent on the visit.

## 2024-05-11 ENCOUNTER — Other Ambulatory Visit: Payer: Self-pay | Admitting: Internal Medicine

## 2024-11-23 NOTE — Progress Notes (Unsigned)
 " Cardiology Office Note:    Date:  11/24/2024   ID:  Veronica Schwartz, DOB Jun 19, 1966, MRN [id number]  PCP:  Zachary Wells RAMAN, FNP   [facility] Providers Cardiologist:  None     Referring MD: Rollene Almarie LABOR, MD   Chief Complaint  Patient presents with   New Patient (Initial Visit)   family history of CAD    History of Present Illness:    Veronica Schwartz is a 59 y.o. female seen at the request of Wells Zachary FNP for evaluation of family history of CAD and aortic aneurysm. She has a history of Hashimoto's thyroiditis.   She generally feels well. Recently she was started on a new thyroid  medication in addition to Synthroid . This made her HR and BP go way up and she went back to taking Synthroid  alone. No history of HTN or DM. Quit smoking 8 years ago. Does have hypercholesterolemia. 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 She exercises regularly with no limitations. No chest pain or dyspnea.   Past Medical History:  Diagnosis Date   Atrophic Hashimoto's thyroiditis    Hyperlipidemia    Hypothyroidism     Past Surgical History:  Procedure Laterality Date   BREAST BIOPSY Left 2017   benign   CESAREAN SECTION     2010   CHEST WALL BIOPSY     THYROID  LOBECTOMY Right 08/17/2018   Procedure: RIGHT THYROID  LOBECTOMY;  Surgeon: Eletha Boas, MD;  Location: [facility];  Service: General;  Laterality: Right;    Current Medications: Active Medications[1]   Allergies:   Penicillins   Social History   Socioeconomic History   Marital status: Divorced    Spouse name: Not on file   Number of children: 2   Years of education: Not on file   Highest education level: Not on file  Occupational History   Not on file  Tobacco Use   Smoking status: Former    Current packs/day: 0.00    Types: Cigarettes    Quit date: 07/06/2016    Years since quitting: 8.3   Smokeless tobacco: Never  Vaping Use   Vaping status: Never Used  Substance and Sexual Activity   Alcohol  use: Yes    Alcohol/week: 1.0 standard drink of alcohol    Types: 1 Glasses of wine per week    Comment: occasional   Drug use: Never   Sexual activity: Not Currently  Other Topics Concern   Not on file  Social History Narrative   Dietician, vegetarian   Social Drivers of Health   Tobacco Use: Medium Risk (11/24/2024)   Patient History    Smoking Tobacco Use: Former    Smokeless Tobacco Use: Never    Passive Exposure: Not on Actuary Strain: Not on file  Food Insecurity: Not on file  Transportation Needs: Not on file  Physical Activity: Not on file  Stress: Not on file  Social Connections: Not on file  Depression (PHQ2-9): Low Risk (03/24/2023)   Depression (PHQ2-9)    PHQ-2 Score: 0  Alcohol Screen: Not on file  Housing: Not on file  Utilities: Not on file  Health Literacy: Not on file     Family History: The patient's family history includes AAA (abdominal aortic aneurysm) in her father and paternal grandmother; Breast cancer (age of onset: 59) in her maternal grandmother and mother; Cancer - Lung in her mother; Colon cancer in her maternal aunt; Heart attack (age of onset: 23) in  her brother; Heart attack (age of onset: 65) in her mother; Heart disease in her brother; Stomach cancer in her paternal aunt. There is no history of Esophageal cancer, Pancreatic cancer, or Rectal cancer.  ROS:   Please see the history of present illness.     All other systems reviewed and are negative.  EKGs/Labs/Other Studies Reviewed:    The following studies were reviewed today: EKG Interpretation Date/Time:  Friday November 24 2024 09:31:13 EST Ventricular Rate:  69 PR Interval:  124 QRS Duration:  82 QT Interval:  388 QTC Calculation: 415 R Axis:   52  Text Interpretation: Normal sinus rhythm Nonspecific T wave abnormality No previous ECGs available Confirmed by Raquell Richer [id number]) on 11/24/2024 9:36:19 AM   Recent Labs: 04/25/2024: ALT 17; BUN 13; Creatinine, Ser  0.73; Hemoglobin 15.5; Platelets 308.0; Potassium 4.1; Sodium 138; TSH 0.82  Recent Lipid Panel    Component Value Date/Time   CHOL 233 (H) 04/25/2024 1534   TRIG 73.0 04/25/2024 1534   HDL 110.20 04/25/2024 1534   CHOLHDL 2 04/25/2024 1534   VLDL 14.6 04/25/2024 1534   LDLCALC 108 (H) 04/25/2024 1534   Dated 11/10/24: cholesterol 263, triglycerides 79, HDL 105, LDL 140. AST 42, ALT 35. Otherwise CMET, CBC and TFTs normal.   Risk Assessment/Calculations:                Physical Exam:    VS:  BP 118/72 (BP Location: Left Arm, Patient Position: Sitting, Cuff Size: Normal)   Pulse 77   Resp 17   Ht 5' 2 (1.575 m)   Wt 142 lb (64.4 kg)   SpO2 94%   BMI 25.97 kg/m     Wt Readings from Last 3 Encounters:  11/24/24 142 lb (64.4 kg)  05/02/24 140 lb (63.5 kg)  04/25/24 140 lb (63.5 kg)     GEN:  Well nourished, well developed in no acute distress HEENT: Normal NECK: No JVD; No carotid bruits LYMPHATICS: No lymphadenopathy CARDIAC: RRR, no murmurs, rubs, gallops RESPIRATORY:  Clear to auscultation without rales, wheezing or rhonchi  ABDOMEN: Soft, non-tender, non-distended MUSCULOSKELETAL:  No edema; No deformity  SKIN: Warm and dry NEUROLOGIC:  Alert and oriented x 3 PSYCHIATRIC:  Normal affect   ASSESSMENT:    1. Family history of early CAD   2. Hypercholesterolemia    PLAN:    In order of problems listed above:  Family history of CAD and AAA. Fortunately CT abdomen in 2022 showed no AAA. Consider screening again in 10 years. I would like to get a better idea of CAD risk and recommend a coronary calcium score. This will inform us  if we need to start statin therapy. Will also get some assessment of thoracic aorta. Continue lifestyle modification.  HLD. If she has calcification would start statin therapy +/- ASA           Medication Adjustments/Labs and Tests Ordered: Current medicines are reviewed at length with the patient today.  Concerns regarding  medicines are outlined above.  Orders Placed This Encounter  Procedures   CT CARDIAC SCORING (SELF PAY ONLY)   EKG 12-Lead   No orders of the defined types were placed in this encounter.   Patient Instructions  Medication Instructions:  Continue same medications   Lab Work: None ordered  Testing/Procedures: Coronary Calcium Score    Follow-Up: At [facility], you and your health needs are our priority.  As part of our continuing mission to provide you with exceptional  heart care, our providers are all part of one team.  This team includes your primary Cardiologist (physician) and Advanced Practice Providers or APPs (Physician Assistants and Nurse Practitioners) who all work together to provide you with the care you need, when you need it.  Your next appointment:  To Be Determined    Provider:  Dr.Egor Fullilove   We recommend signing up for the patient portal called MyChart.  Sign up information is provided on this After Visit Summary.  MyChart is used to connect with patients for Virtual Visits (Telemedicine).  Patients are able to view lab/test results, encounter notes, upcoming appointments, etc.  Non-urgent messages can be sent to your provider as well.   To learn more about what you can do with MyChart, go to [email or web address].             Signed, Mahnoor Mathisen, MD  11/24/2024 9:59 AM    [facility]     [1]  Current Meds  Medication Sig   Cholecalciferol (VITAMIN D ) 50 MCG (2000 UT) CAPS Take 1 capsule by mouth daily.   Cyanocobalamin  (VITAMIN B-12 PO) Take 1 each by mouth daily.   levothyroxine  (SYNTHROID ) 150 MCG tablet Take 1 tablet (150 mcg total) by mouth daily before breakfast.   MAGNESIUM PO Take 1,000 mg by mouth at bedtime.   NON FORMULARY 1 mL daily. Testosterone 4mg /ml   Omega-3 Fatty Acids (FISH OIL) 1000 MG CAPS Take 2,000 mg by mouth daily.   Probiotic Product (PROBIOTIC PO) Take 1 capsule by mouth daily.   progesterone  (PROMETRIUM) 100 MG capsule Take 100-200 mg by mouth at bedtime.   valACYclovir (VALTREX) 1000 MG tablet Take 1,000 mg by mouth daily.    "

## 2024-11-24 ENCOUNTER — Encounter: Payer: Self-pay | Admitting: Cardiology

## 2024-11-24 ENCOUNTER — Ambulatory Visit (HOSPITAL_COMMUNITY)
Admission: RE | Admit: 2024-11-24 | Payer: Self-pay | Source: Ambulatory Visit | Attending: Cardiology | Admitting: Cardiology

## 2024-11-24 ENCOUNTER — Ambulatory Visit: Payer: Self-pay | Admitting: Cardiology

## 2024-11-24 ENCOUNTER — Ambulatory Visit: Admitting: Cardiology

## 2024-11-24 VITALS — BP 118/72 | HR 77 | Resp 17 | Ht 62.0 in | Wt 142.0 lb

## 2024-11-24 DIAGNOSIS — Z8249 Family history of ischemic heart disease and other diseases of the circulatory system: Secondary | ICD-10-CM

## 2024-11-24 DIAGNOSIS — E7800 Pure hypercholesterolemia, unspecified: Secondary | ICD-10-CM

## 2024-11-24 NOTE — Patient Instructions (Addendum)
 Medication Instructions:  Continue same medications  Lab Work: None ordered  Testing/Procedures: Coronary Calcium  Score   Follow-Up: At [facility], you and your health needs are our priority.  As part of our continuing mission to provide you with exceptional heart care, our providers are all part of one team.  This team includes your primary Cardiologist (physician) and Advanced Practice Providers or APPs (Physician Assistants and Nurse Practitioners) who all work together to provide you with the care you need, when you need it.  Your next appointment:  To Be Determined    Provider:  Dr.Jordan    We recommend signing up for the patient portal called MyChart.  Sign up information is provided on this After Visit Summary.  MyChart is used to connect with patients for Virtual Visits (Telemedicine).  Patients are able to view lab/test results, encounter notes, upcoming appointments, etc.  Non-urgent messages can be sent to your provider as well.   To learn more about what you can do with MyChart, go to [email or web address].
